# Patient Record
Sex: Male | Born: 1970
Health system: Southern US, Community
[De-identification: ages and names within clinical notes are randomized; demographics above are authoritative.]

## PROBLEM LIST (undated history)

## (undated) DIAGNOSIS — E785 Hyperlipidemia, unspecified: Secondary | ICD-10-CM

## (undated) DIAGNOSIS — I10 Essential (primary) hypertension: Secondary | ICD-10-CM

## (undated) HISTORY — PX: KIDNEY STONE SURGERY: SHX686

## (undated) HISTORY — DX: Hyperlipidemia, unspecified: E78.5

## (undated) HISTORY — DX: Essential (primary) hypertension: I10

---

## 2009-11-16 ENCOUNTER — Emergency Department (HOSPITAL_COMMUNITY): Admission: EM | Admit: 2009-11-16 | Discharge: 2009-11-17 | Payer: Self-pay | Admitting: Emergency Medicine

## 2010-05-20 LAB — DIFFERENTIAL
Basophils Absolute: 0 K/uL (ref 0.0–0.1)
Basophils Relative: 0 % (ref 0–1)
Eosinophils Absolute: 0 K/uL (ref 0.0–0.7)
Eosinophils Relative: 0 % (ref 0–5)
Lymphocytes Relative: 13 % (ref 12–46)
Lymphs Abs: 1.4 K/uL (ref 0.7–4.0)
Monocytes Absolute: 1.1 10*3/uL — ABNORMAL HIGH (ref 0.1–1.0)
Monocytes Relative: 10 % (ref 3–12)
Neutro Abs: 8.3 10*3/uL — ABNORMAL HIGH (ref 1.7–7.7)
Neutrophils Relative %: 77 % (ref 43–77)

## 2010-05-20 LAB — BASIC METABOLIC PANEL
Calcium: 8.7 mg/dL (ref 8.4–10.5)
GFR calc Af Amer: >60 mL/min (ref >60)
GFR calc non Af Amer: >60 mL/min (ref >60)
Potassium: 3.4 mEq/L — ABNORMAL LOW (ref 3.5–5.1)
Sodium: 140 mEq/L (ref 135–145)

## 2010-05-20 LAB — BASIC METABOLIC PANEL WITH GFR
BUN: 10 mg/dL (ref 6–23)
CO2: 27 meq/L (ref 19–32)
Chloride: 105 meq/L (ref 96–112)
Creatinine, Ser: 0.98 mg/dL (ref 0.4–1.5)
Glucose, Bld: 97 mg/dL (ref 70–99)

## 2010-05-20 LAB — CBC
HCT: 44.6 % (ref 39.0–52.0)
Hemoglobin: 14.9 g/dL (ref 13.0–17.0)
MCH: 30.3 pg (ref 26.0–34.0)
MCHC: 33.4 g/dL (ref 30.0–36.0)
MCV: 90.7 fL (ref 78.0–100.0)
Platelets: 202 K/uL (ref 150–400)
RBC: 4.92 MIL/uL (ref 4.22–5.81)
RDW: 13.3 % (ref 11.5–15.5)
WBC: 10.9 10*3/uL — ABNORMAL HIGH (ref 4.0–10.5)

## 2012-06-11 ENCOUNTER — Other Ambulatory Visit: Payer: Self-pay | Admitting: Physician Assistant

## 2015-09-25 DIAGNOSIS — J0100 Acute maxillary sinusitis, unspecified: Secondary | ICD-10-CM | POA: Diagnosis not present

## 2015-09-25 DIAGNOSIS — H66001 Acute suppurative otitis media without spontaneous rupture of ear drum, right ear: Secondary | ICD-10-CM | POA: Diagnosis not present

## 2016-06-13 DIAGNOSIS — H60392 Other infective otitis externa, left ear: Secondary | ICD-10-CM | POA: Diagnosis not present

## 2016-06-23 ENCOUNTER — Ambulatory Visit (INDEPENDENT_AMBULATORY_CARE_PROVIDER_SITE_OTHER): Payer: BLUE CROSS/BLUE SHIELD | Admitting: Family Medicine

## 2016-06-23 ENCOUNTER — Encounter: Payer: Self-pay | Admitting: Family Medicine

## 2016-06-23 VITALS — BP 147/99 | HR 93 | Temp 98.2°F | Ht 71.0 in | Wt >= 6400 oz

## 2016-06-23 DIAGNOSIS — R748 Abnormal levels of other serum enzymes: Secondary | ICD-10-CM

## 2016-06-23 DIAGNOSIS — G4733 Obstructive sleep apnea (adult) (pediatric): Secondary | ICD-10-CM

## 2016-06-23 DIAGNOSIS — E785 Hyperlipidemia, unspecified: Secondary | ICD-10-CM

## 2016-06-23 DIAGNOSIS — Z23 Encounter for immunization: Secondary | ICD-10-CM

## 2016-06-23 DIAGNOSIS — M259 Joint disorder, unspecified: Secondary | ICD-10-CM | POA: Diagnosis not present

## 2016-06-23 DIAGNOSIS — I10 Essential (primary) hypertension: Secondary | ICD-10-CM | POA: Diagnosis not present

## 2016-06-23 DIAGNOSIS — H60332 Swimmer's ear, left ear: Secondary | ICD-10-CM

## 2016-06-23 MED ORDER — LOSARTAN POTASSIUM 25 MG PO TABS: 25.0000 mg | ORAL_TABLET | Freq: Every day | ORAL | 2 refills | Status: DC

## 2016-06-23 MED ORDER — MELOXICAM 15 MG PO TABS
15.0000 mg | ORAL_TABLET | Freq: Every day | ORAL | 2 refills | Status: DC
Start: 2016-06-23 — End: 2016-10-14

## 2016-06-23 MED ORDER — AMLODIPINE BESYLATE 5 MG PO TABS: 5.0000 mg | ORAL_TABLET | Freq: Every day | ORAL | 2 refills | Status: DC

## 2016-06-23 MED ORDER — NEOMYCIN-POLYMYXIN-HC 3.5-10000-1 OT SOLN: 3.0000 [drp] | Freq: Four times a day (QID) | OTIC | 0 refills | Status: DC

## 2016-06-23 MED ORDER — PRAVASTATIN SODIUM 40 MG PO TABS: 40.0000 mg | ORAL_TABLET | Freq: Every day | ORAL | 2 refills | Status: DC

## 2016-06-23 NOTE — Progress Notes (Signed)
BP (!) 146/97   Pulse 93   Temp 98.2 F (36.8 C) (Oral)   Ht 5' 11"  (1.803 m)   Wt (!) 444 lb (201.4 kg)   BMI 61.93 kg/m    Subjective:    Patient ID: Nathaniel Martin, male    DOB: [birth year], 46 y.o.   MRN: [id number]  HPI: Nathaniel Martin is a 46 y.o. male presenting on 06/23/2016 for Establish Care (patient out of Losartan x 2 weeks) and Follow up on ear infection (left ear)   HPI Hyperlipidemia Patient is coming in for check of his hyperlipidemia and to establish care with our office as a new patient. He is currently taking pravastatin. He denies any issues with myalgias or history of liver damage from it. He denies any focal numbness or weakness or chest pain.   Hypertension Patient is coming in for a blood pressure check and to establish care with our office. His blood pressure is 146/97. He is currently on amlodipine and losartan. He has been taking losartan but ran out of the amlodipine a week and a half ago. Patient denies headaches, blurred vision, chest pains, shortness of breath, or weakness. Denies any side effects from medication and is content with current medication.   Bilateral knee pain Patient has chronic bilateral knee issues and takes Tylenol and ibuprofen and says that they worked somewhat for it. He would like to try something different if possible. The knee pain is throughout the joints but hurts more on the medial aspect of both knees and lateral. He denies any popping or catching or giving way of his knees or redness or warmth. The knee pain is more of a tight squeezing sensation.  Sleep apnea and obesity Patient says he has been diagnosed with sleep apnea and he is obese as well. He has not been able to use the mask because of issues with it but he has not seen a breaking sleep medicine for quite some time. His patient coming from Dr. Eula Fried and Dr. Eula Fried is the one who initially ordered this for him.  Left ear pain and drainage Patient has been  having left ear pain and drainage out of that ear that has been bothering him for the past few days. He is never has these issues before. He denies any fevers or chills or sinus congestion or cough or runny nose.  Elevated liver enzymes Patient has elevated liver enzymes on external lab is coming in to get him recheck today. They were suspicious that it was due to his morbid obesity and fatty liver disease but he never did a full workup for it. He denies any abdominal pain or skin color discoloration.  Relevant past medical, surgical, family and social history reviewed and updated as indicated. Interim medical history since our last visit reviewed. Allergies and medications reviewed and updated.  Review of Systems  Constitutional: Negative for chills and fever.  HENT: Positive for ear discharge and ear pain.   Eyes: Negative for discharge.  Respiratory: Negative for shortness of breath and wheezing.   Cardiovascular: Negative for chest pain and leg swelling.  Gastrointestinal: Negative for abdominal pain, constipation, diarrhea and vomiting.  Musculoskeletal: Positive for arthralgias. Negative for back pain and gait problem.  Skin: Negative for color change and rash.  Psychiatric/Behavioral: Positive for sleep disturbance.  All other systems reviewed and are negative.   Per HPI unless specifically indicated above  Social History   Social History  . Marital status: Married  Spouse name: N/A  . Number of children: N/A  . Years of education: N/A   Occupational History  . Not on file.   Social History Main Topics  . Smoking status: Never Smoker  . Smokeless tobacco: Never Used  . Alcohol use No  . Drug use: No  . Sexual activity: Not Currently     Comment: married 12 years, 1 daughter, gone from house   Other Topics Concern  . Not on file   Social History Narrative  . No narrative on file    Past Surgical History:  Procedure Laterality Date  . KIDNEY STONE SURGERY        Family History  Problem Relation Age of Onset  . Diabetes Mother   . Diabetes Father   . Cancer Father   . Diabetes Brother   . Heart disease Paternal Grandmother   . Heart attack Paternal Grandmother   . Heart disease Paternal Grandfather   . Heart attack Paternal Grandfather   . Diabetes Brother     Allergies as of 06/23/2016   No Known Allergies     Medication List       Accurate as of 06/23/16  9:34 AM. Always use your most recent med list.          acetaminophen 500 MG tablet Commonly known as:  TYLENOL Take 1,000 mg by mouth every 6 (six) hours as needed.   amLODipine 5 MG tablet Commonly known as:  NORVASC Take 5 mg by mouth daily.   ibuprofen 400 MG tablet Commonly known as:  ADVIL,MOTRIN Take 400 mg by mouth every 6 (six) hours as needed.   losartan 25 MG tablet Commonly known as:  COZAAR Take 25 mg by mouth daily.   pravastatin 40 MG tablet Commonly known as:  PRAVACHOL Take 40 mg by mouth daily.          Objective:    BP (!) 146/97   Pulse 93   Temp 98.2 F (36.8 C) (Oral)   Ht 5' 11"  (1.803 m)   Wt (!) 444 lb (201.4 kg)   BMI 61.93 kg/m   Wt Readings from Last 3 Encounters:  06/23/16 (!) 444 lb (201.4 kg)    Physical Exam  Constitutional: He is oriented to person, place, and time. He appears well-developed and well-nourished. No distress.  HENT:  Right Ear: No swelling or tenderness. No mastoid tenderness. Tympanic membrane is not injected, not scarred, not perforated, not erythematous and not retracted. No hemotympanum. No decreased hearing is noted.  Left Ear: There is drainage, swelling and tenderness. No mastoid tenderness. Tympanic membrane is not injected, not scarred, not perforated, not erythematous and not retracted. No decreased hearing is noted.  Eyes: Conjunctivae are normal. No scleral icterus.  Neck: Neck supple. No thyromegaly present.  Cardiovascular: Normal rate, regular rhythm, normal heart sounds and intact distal  pulses.   No murmur heard. Pulmonary/Chest: Effort normal and breath sounds normal. No respiratory distress. He has no wheezes.  Abdominal: Soft. Bowel sounds are normal. He exhibits no distension. There is no tenderness. There is no rebound.  Musculoskeletal: Normal range of motion. He exhibits no edema.       Right knee: He exhibits normal range of motion, no swelling, normal alignment, no LCL laxity, no bony tenderness, normal meniscus and no MCL laxity. Tenderness found. Medial joint line tenderness noted.       Left knee: He exhibits no erythema, normal alignment, no LCL laxity, no bony tenderness, normal meniscus  and no MCL laxity. Tenderness found. Medial joint line tenderness noted.  Lymphadenopathy:    He has no cervical adenopathy.  Neurological: He is alert and oriented to person, place, and time. Coordination normal.  Skin: Skin is warm and dry. No rash noted. He is not diaphoretic.  Psychiatric: He has a normal mood and affect. His behavior is normal.  Nursing note and vitals reviewed.       Assessment & Plan:   Problem List Items Addressed This Visit      Cardiovascular and Mediastinum   Hypertension   Relevant Medications   amLODipine (NORVASC) 5 MG tablet   losartan (COZAAR) 25 MG tablet   pravastatin (PRAVACHOL) 40 MG tablet   Other Relevant Orders   CMP14+EGFR (Completed)     Other   Hyperlipidemia LDL goal <130 - Primary   Relevant Medications   amLODipine (NORVASC) 5 MG tablet   losartan (COZAAR) 25 MG tablet   pravastatin (PRAVACHOL) 40 MG tablet   Other Relevant Orders   Lipid panel (Completed)   Morbid obesity ([place])   Relevant Orders   CBC with Differential/Platelet (Completed)   TSH (Completed)   Ambulatory referral to Sleep Studies   Knee problem    Other Visit Diagnoses    Obstructive sleep apnea syndrome       Relevant Orders   CBC with Differential/Platelet (Completed)   TSH (Completed)   Ambulatory referral to Sleep Studies   Acute  swimmer's ear of left side       Relevant Medications   neomycin-polymyxin-hydrocortisone (CORTISPORIN) otic solution   Elevated liver enzymes       Relevant Orders   US Abdomen Limited RUQ       Follow up plan: Return in about 4 weeks (around 07/21/2016), or if symptoms worsen or fail to improve, for htn.  Caryl Pina, MD [facility] 06/23/2016, 9:34 AM

## 2016-06-24 LAB — CBC WITH DIFFERENTIAL/PLATELET
Basophils Absolute: 0 10*3/uL (ref 0.0–0.2)
Basos: 0 %
EOS (ABSOLUTE): 0.1 10*3/uL (ref 0.0–0.4)
Eos: 2 %
Hematocrit: 42.6 % (ref 37.5–51.0)
Hemoglobin: 14.4 g/dL (ref 13.0–17.7)
Immature Grans (Abs): 0 10*3/uL (ref 0.0–0.1)
Immature Granulocytes: 0 %
Lymphocytes Absolute: 1.8 10*3/uL (ref 0.7–3.1)
Lymphs: 26 %
MCH: 29.6 pg (ref 26.6–33.0)
MCHC: 33.8 g/dL (ref 31.5–35.7)
MCV: 88 fL (ref 79–97)
Monocytes Absolute: 0.5 10*3/uL (ref 0.1–0.9)
Monocytes: 7 %
Neutrophils Absolute: 4.5 10*3/uL (ref 1.4–7.0)
Neutrophils: 65 %
Platelets: 287 10*3/uL (ref 150–379)
RBC: 4.87 x10E6/uL (ref 4.14–5.80)
RDW: 13.1 % (ref 12.3–15.4)
WBC: 6.9 10*3/uL (ref 3.4–10.8)

## 2016-06-24 LAB — LIPID PANEL
Chol/HDL Ratio: 4.7 ratio (ref 0.0–5.0)
Cholesterol, Total: 174 mg/dL (ref 100–199)
HDL: 37 mg/dL — ABNORMAL LOW (ref >39)
LDL Calculated: 119 mg/dL — ABNORMAL HIGH (ref 0–99)
Triglycerides: 90 mg/dL (ref 0–149)
VLDL Cholesterol Cal: 18 mg/dL (ref 5–40)

## 2016-06-24 LAB — CMP14+EGFR
ALT: 56 IU/L — ABNORMAL HIGH (ref 0–44)
AST: 53 IU/L — ABNORMAL HIGH (ref 0–40)
Albumin/Globulin Ratio: 1.2 (ref 1.2–2.2)
Albumin: 4.3 g/dL (ref 3.5–5.5)
Alkaline Phosphatase: 101 IU/L (ref 39–117)
BUN/Creatinine Ratio: 11 (ref 9–20)
BUN: 10 mg/dL (ref 6–24)
Bilirubin Total: 0.3 mg/dL (ref 0.0–1.2)
CO2: 23 mmol/L (ref 18–29)
Calcium: 9.4 mg/dL (ref 8.7–10.2)
Chloride: 101 mmol/L (ref 96–106)
Creatinine, Ser: 0.91 mg/dL (ref 0.76–1.27)
GFR calc Af Amer: 117 mL/min/{1.73_m2} (ref >59)
GFR calc non Af Amer: 101 mL/min/{1.73_m2} (ref >59)
Globulin, Total: 3.5 g/dL (ref 1.5–4.5)
Glucose: 117 mg/dL — ABNORMAL HIGH (ref 65–99)
Potassium: 4.8 mmol/L (ref 3.5–5.2)
Sodium: 143 mmol/L (ref 134–144)
Total Protein: 7.8 g/dL (ref 6.0–8.5)

## 2016-06-24 LAB — TSH: TSH: 1.16 u[IU]/mL (ref 0.450–4.500)

## 2016-06-27 LAB — SPECIMEN STATUS REPORT

## 2016-06-27 LAB — HGB A1C W/O EAG: Hgb A1c MFr Bld: 6.1 % — ABNORMAL HIGH (ref 4.8–5.6)

## 2016-07-04 ENCOUNTER — Ambulatory Visit (HOSPITAL_COMMUNITY): Payer: Self-pay

## 2016-07-06 ENCOUNTER — Ambulatory Visit (HOSPITAL_COMMUNITY): Admission: RE | Admit: 2016-07-06 | Payer: BLUE CROSS/BLUE SHIELD | Source: Ambulatory Visit

## 2016-07-12 ENCOUNTER — Ambulatory Visit: Payer: BLUE CROSS/BLUE SHIELD | Admitting: Pharmacist

## 2016-07-20 ENCOUNTER — Institutional Professional Consult (permissible substitution): Payer: BLUE CROSS/BLUE SHIELD | Admitting: Neurology

## 2016-07-21 ENCOUNTER — Encounter: Payer: Self-pay | Admitting: Family Medicine

## 2016-07-21 ENCOUNTER — Ambulatory Visit (INDEPENDENT_AMBULATORY_CARE_PROVIDER_SITE_OTHER): Payer: BLUE CROSS/BLUE SHIELD | Admitting: Family Medicine

## 2016-07-21 VITALS — BP 137/82 | HR 87 | Temp 97.6°F | Ht 71.0 in | Wt >= 6400 oz

## 2016-07-21 DIAGNOSIS — I10 Essential (primary) hypertension: Secondary | ICD-10-CM

## 2016-07-21 NOTE — Progress Notes (Signed)
   BP 137/82   Pulse 87   Temp 97.6 F (36.4 C) (Oral)   Ht 5\' 11"  (1.803 m)   Wt (!) 445 lb (201.9 kg)   BMI 62.06 kg/m    Subjective:    Patient ID: Nathaniel Martin CurrentJonathon E Martin, male    DOB: 06/20/1970, 46 y.o.   MRN: [id number]  HPI: Nathaniel Martin CurrentJonathon E Martin is a 46 y.o. male presenting on 07/21/2016 for Hypertension (4 week followup)   HPI Hypertension Patient is currently on amlodipine and losartan, and her blood pressure today is 137/82, today was just a recheck of that and it is much improved. Patient denies any lightheadedness or dizziness. Patient denies headaches, blurred vision, chest pains, shortness of breath, or weakness. Denies any side effects from medication and is content with current medication.   Relevant past medical, surgical, family and social history reviewed and updated as indicated. Interim medical history since our last visit reviewed. Allergies and medications reviewed and updated.  Review of Systems  Constitutional: Negative for chills and fever.  Respiratory: Negative for shortness of breath and wheezing.   Cardiovascular: Negative for chest pain and leg swelling.  Musculoskeletal: Negative for back pain and gait problem.  Skin: Negative for rash.  Neurological: Negative for dizziness, light-headedness and headaches.  All other systems reviewed and are negative.   Per HPI unless specifically indicated above        Objective:    BP 137/82   Pulse 87   Temp 97.6 F (36.4 C) (Oral)   Ht 5\' 11"  (1.803 m)   Wt (!) 445 lb (201.9 kg)   BMI 62.06 kg/m   Wt Readings from Last 3 Encounters:  07/21/16 (!) 445 lb (201.9 kg)  06/23/16 (!) 444 lb (201.4 kg)    Physical Exam  Constitutional: He is oriented to person, place, and time. He appears well-developed and well-nourished. No distress.  Morbidly obese  Eyes: Conjunctivae are normal. No scleral icterus.  Cardiovascular: Normal rate, regular rhythm, normal heart sounds and intact distal pulses.   No  murmur heard. Pulmonary/Chest: Effort normal and breath sounds normal. No respiratory distress. He has no wheezes. He has no rales.  Musculoskeletal: Normal range of motion. He exhibits no edema.  Neurological: He is alert and oriented to person, place, and time. Coordination normal.  Skin: Skin is warm and dry. No rash noted. He is not diaphoretic.  Psychiatric: He has a normal mood and affect. His behavior is normal.  Nursing note and vitals reviewed.     Assessment & Plan:   Problem List Items Addressed This Visit      Cardiovascular and Mediastinum   Hypertension - Primary    Blood pressure much improved today. Return in 5-6 months and continue to monitor.          Follow up plan: Return in about 6 months (around 01/21/2017), or if symptoms worsen or fail to improve, for Hypertension and fasting labs.  Counseling provided for all of the vaccine components No orders of the defined types were placed in this encounter.   Arville CareJoshua Carrington Olazabal, MD [facility] 07/21/2016, 8:46 AM

## 2016-07-21 NOTE — Assessment & Plan Note (Signed)
Blood pressure much improved today. Return in 5-6 months and continue to monitor.

## 2016-10-14 ENCOUNTER — Ambulatory Visit (INDEPENDENT_AMBULATORY_CARE_PROVIDER_SITE_OTHER): Payer: BLUE CROSS/BLUE SHIELD | Admitting: Family Medicine

## 2016-10-14 ENCOUNTER — Encounter: Payer: Self-pay | Admitting: Family Medicine

## 2016-10-14 VITALS — BP 138/83 | HR 75 | Temp 97.0°F | Ht 71.0 in | Wt >= 6400 oz

## 2016-10-14 DIAGNOSIS — Z713 Dietary counseling and surveillance: Secondary | ICD-10-CM

## 2016-10-14 DIAGNOSIS — S39012A Strain of muscle, fascia and tendon of lower back, initial encounter: Secondary | ICD-10-CM | POA: Diagnosis not present

## 2016-10-14 MED ORDER — TOPIRAMATE 25 MG PO TABS: 25.0000 mg | ORAL_TABLET | Freq: Two times a day (BID) | ORAL | 2 refills | Status: DC

## 2016-10-14 MED ORDER — PHENTERMINE HCL 30 MG PO CAPS: 30.0000 mg | ORAL_CAPSULE | ORAL | 0 refills | Status: DC

## 2016-10-14 MED ORDER — CYCLOBENZAPRINE HCL 10 MG PO TABS: 10.0000 mg | ORAL_TABLET | Freq: Three times a day (TID) | ORAL | 0 refills | Status: DC | PRN

## 2016-10-14 MED ORDER — PREDNISONE 20 MG PO TABS: ORAL_TABLET | ORAL | 0 refills | Status: DC

## 2016-10-14 NOTE — Progress Notes (Signed)
BP 138/83   Pulse 75   Temp (!) 97 F (36.1 C) (Oral)   Ht 5\' 11"  (1.803 m)   Wt (!) 432 lb (196 kg)   BMI 60.25 kg/m    Subjective:    Patient ID: Nathaniel Martin, male    DOB: [birth year], 46 y.o.   MRN: [id number]  HPI: Nathaniel Martin is a 46 y.o. male presenting on 10/14/2016 for Back Pain (pt here today c/o lower back pain)   HPI Low back pain and morbid obesity Patient is coming in today complaining of low back pain. He has been having chronic low back pain but is acutely worsened over the past couple weeks. He says that it hurts in a bandlike area across his lower and mid back and rates it as 7 out of 10 currently. He denies any fevers or chills or loss of bowel or bladder. He denies any pain radiating down legs  Relevant past medical, surgical, family and social history reviewed and updated as indicated. Interim medical history since our last visit reviewed. Allergies and medications reviewed and updated.  Review of Systems  Constitutional: Negative for chills and fever.  Respiratory: Negative for shortness of breath and wheezing.   Cardiovascular: Negative for chest pain and leg swelling.  Gastrointestinal: Negative for abdominal pain.  Musculoskeletal: Positive for back pain. Negative for gait problem.  Skin: Negative for rash.  Neurological: Negative for dizziness and light-headedness.  All other systems reviewed and are negative.   Per HPI unless specifically indicated above      Objective:    BP 138/83   Pulse 75   Temp (!) 97 F (36.1 C) (Oral)   Ht 5\' 11"  (1.803 m)   Wt (!) 432 lb (196 kg)   BMI 60.25 kg/m   Wt Readings from Last 3 Encounters:  10/14/16 (!) 432 lb (196 kg)  07/21/16 (!) 445 lb (201.9 kg)  06/23/16 (!) 444 lb (201.4 kg)    Physical Exam  Constitutional: He is oriented to person, place, and time. He appears well-developed and well-nourished. No distress.  Eyes: Conjunctivae are normal. No scleral icterus.  Cardiovascular:  Normal rate, regular rhythm, normal heart sounds and intact distal pulses.   No murmur heard. Pulmonary/Chest: Effort normal and breath sounds normal. No respiratory distress. He has no wheezes. He has no rales.  Abdominal: Soft. Bowel sounds are normal. He exhibits no distension. There is no tenderness. There is no rebound.  Musculoskeletal: Normal range of motion. He exhibits no edema.       Lumbar back: He exhibits tenderness (band-like area in low back). He exhibits normal range of motion, no bony tenderness and no deformity.  Neurological: He is alert and oriented to person, place, and time. Coordination normal.  Skin: Skin is warm and dry. No rash noted. He is not diaphoretic.  Psychiatric: He has a normal mood and affect. His behavior is normal.  Nursing note and vitals reviewed.      Assessment & Plan:   Problem List Items Addressed This Visit      Other   Morbid obesity (HCC)   Relevant Medications   phentermine 30 MG capsule   topiramate (TOPAMAX) 25 MG tablet    Other Visit Diagnoses    Lumbar strain, initial encounter    -  Primary   Relevant Medications   predniSONE (DELTASONE) 20 MG tablet   cyclobenzaprine (FLEXERIL) 10 MG tablet   Weight loss counseling, encounter for  Relevant Medications   phentermine 30 MG capsule   topiramate (TOPAMAX) 25 MG tablet       Follow up plan: Return in about 4 weeks (around 11/11/2016), or if symptoms worsen or fail to improve, for Recheck weight.  Counseling provided for all of the vaccine components No orders of the defined types were placed in this encounter.   Arville CareJoshua Dettinger, MD [facility] 10/14/2016, 10:34 AM

## 2016-11-10 ENCOUNTER — Ambulatory Visit: Payer: BLUE CROSS/BLUE SHIELD | Admitting: Family Medicine

## 2016-12-21 ENCOUNTER — Ambulatory Visit (INDEPENDENT_AMBULATORY_CARE_PROVIDER_SITE_OTHER): Payer: BLUE CROSS/BLUE SHIELD | Admitting: Family Medicine

## 2016-12-21 ENCOUNTER — Encounter: Payer: Self-pay | Admitting: Family Medicine

## 2016-12-21 VITALS — BP 135/83 | HR 88 | Temp 97.6°F | Ht 71.0 in | Wt >= 6400 oz

## 2016-12-21 DIAGNOSIS — I10 Essential (primary) hypertension: Secondary | ICD-10-CM

## 2016-12-21 DIAGNOSIS — E1169 Type 2 diabetes mellitus with other specified complication: Secondary | ICD-10-CM | POA: Insufficient documentation

## 2016-12-21 DIAGNOSIS — E785 Hyperlipidemia, unspecified: Secondary | ICD-10-CM

## 2016-12-21 DIAGNOSIS — R7303 Prediabetes: Secondary | ICD-10-CM

## 2016-12-21 LAB — BAYER DCA HB A1C WAIVED: HB A1C: 5.8 % (ref <7.0)

## 2016-12-21 NOTE — Progress Notes (Signed)
 BP 135/83   Pulse 88   Temp 97.6 F (36.4 C) (Oral)   Ht 5' 11" (1.803 m)   Wt (!) 411 lb 4 oz (186.5 kg)   BMI 57.36 kg/m    Subjective:    Patient ID: Nathaniel Martin, male    DOB: 07/06/1970, 46 y.o.   MRN: [id number]  HPI: Nathaniel Martin is a 46 y.o. male presenting on 12/21/2016 for Hyperlipidemia (follow up); Hypertension; and Weight Loss (stopped Phenterimine and Topamax, did not feel was helping, made him jittery)   HPI Hypertension Patient is currently on losartan and amlodipine, and their blood pressure today is 135/83. Patient denies any lightheadedness or dizziness. Patient denies headaches, blurred vision, chest pains, shortness of breath, or weakness. Denies any side effects from medication and is content with current medication.   Hyperlipidemia Patient is coming in for recheck of his hyperlipidemia. The patient is currently taking pravastatin. They deny any issues with myalgias or history of liver damage from it. They deny any focal numbness or weakness or chest pain.   Prediabetes Patient comes in today for recheck of his diabetes. Patient has been currently taking dietary and lifestyle changes, patient is down 20 pounds from where he was previously. Patient is currently on an ACE inhibitor/ARB. Patient has not seen an ophthalmologist this year. Patient denies any issues with their feet.   Relevant past medical, surgical, family and social history reviewed and updated as indicated. Interim medical history since our last visit reviewed. Allergies and medications reviewed and updated.  Review of Systems  Constitutional: Negative for chills and fever.  Respiratory: Negative for cough, shortness of breath and wheezing.   Cardiovascular: Negative for chest pain, palpitations and leg swelling.  Musculoskeletal: Positive for arthralgias and back pain. Negative for myalgias.  Skin: Negative for rash.  Neurological: Negative for dizziness, weakness and headaches.   Psychiatric/Behavioral: Negative for suicidal ideas.    Per HPI unless specifically indicated above     Objective:    BP 135/83   Pulse 88   Temp 97.6 F (36.4 C) (Oral)   Ht 5' 11" (1.803 m)   Wt (!) 411 lb 4 oz (186.5 kg)   BMI 57.36 kg/m   Wt Readings from Last 3 Encounters:  12/21/16 (!) 411 lb 4 oz (186.5 kg)  10/14/16 (!) 432 lb (196 kg)  07/21/16 (!) 445 lb (201.9 kg)    Physical Exam  Constitutional: He is oriented to person, place, and time. He appears well-developed and well-nourished. No distress.  Eyes: Conjunctivae are normal. No scleral icterus.  Cardiovascular: Normal rate, regular rhythm, normal heart sounds and intact distal pulses.   No murmur heard. Pulmonary/Chest: Effort normal and breath sounds normal. No respiratory distress. He has no wheezes. He has no rales.  Musculoskeletal: Normal range of motion. He exhibits no edema.  Neurological: He is alert and oriented to person, place, and time. Coordination normal.  Skin: Skin is warm and dry. No rash noted. He is not diaphoretic.  Psychiatric: He has a normal mood and affect. His behavior is normal.  Nursing note and vitals reviewed.     Assessment & Plan:   Problem List Items Addressed This Visit      Cardiovascular and Mediastinum   Hypertension - Primary   Relevant Orders   CMP14+EGFR   Thyroid Panel With TSH     Other   Hyperlipidemia LDL goal <130   Relevant Orders   Lipid panel   Morbid obesity (HCC)     Relevant Orders   Thyroid Panel With TSH   Prediabetes   Relevant Orders   Bayer DCA Hb A1c Waived   CMP14+EGFR   Thyroid Panel With TSH     Continue patient's current medications, discussed Victoza for possible weight loss but patient does not want to do it right now. He has been losing weight on his own he would like to continue to try and his own.  Follow up plan: Return in about 6 months (around 06/21/2017), or if symptoms worsen or fail to improve, for Hypertension and  prediabetes.  Counseling provided for all of the vaccine components Orders Placed This Encounter  Procedures  . Bayer DCA Hb A1c Waived  . CMP14+EGFR  . Lipid panel  . Thyroid Panel With TSH    Joshua Dettinger, MD [facility] 12/21/2016, 8:40 AM     

## 2016-12-22 LAB — CMP14+EGFR
ALT: 28 IU/L (ref 0–44)
AST: 30 IU/L (ref 0–40)
Albumin/Globulin Ratio: 1.5 (ref 1.2–2.2)
Albumin: 4.4 g/dL (ref 3.5–5.5)
Alkaline Phosphatase: 85 IU/L (ref 39–117)
BUN/Creatinine Ratio: 18 (ref 9–20)
BUN: 17 mg/dL (ref 6–24)
Bilirubin Total: 0.5 mg/dL (ref 0.0–1.2)
CALCIUM: 9.4 mg/dL (ref 8.7–10.2)
CO2: 19 mmol/L — AB (ref 20–29)
CREATININE: 0.94 mg/dL (ref 0.76–1.27)
Chloride: 105 mmol/L (ref 96–106)
GFR calc Af Amer: 113 mL/min/{1.73_m2} (ref >59)
GFR, EST NON AFRICAN AMERICAN: 98 mL/min/{1.73_m2} (ref >59)
GLOBULIN, TOTAL: 2.9 g/dL (ref 1.5–4.5)
Glucose: 107 mg/dL — ABNORMAL HIGH (ref 65–99)
Potassium: 4.6 mmol/L (ref 3.5–5.2)
Sodium: 144 mmol/L (ref 134–144)
TOTAL PROTEIN: 7.3 g/dL (ref 6.0–8.5)

## 2016-12-22 LAB — THYROID PANEL WITH TSH
Free Thyroxine Index: 2 (ref 1.2–4.9)
T3 Uptake Ratio: 26 % (ref 24–39)
T4, Total: 7.8 ug/dL (ref 4.5–12.0)
TSH: 1.54 u[IU]/mL (ref 0.450–4.500)

## 2016-12-22 LAB — LIPID PANEL
CHOL/HDL RATIO: 3.2 ratio (ref 0.0–5.0)
Cholesterol, Total: 126 mg/dL (ref 100–199)
HDL: 40 mg/dL (ref >39)
LDL CALC: 71 mg/dL (ref 0–99)
TRIGLYCERIDES: 74 mg/dL (ref 0–149)
VLDL Cholesterol Cal: 15 mg/dL (ref 5–40)

## 2017-01-16 ENCOUNTER — Ambulatory Visit (INDEPENDENT_AMBULATORY_CARE_PROVIDER_SITE_OTHER): Payer: BLUE CROSS/BLUE SHIELD

## 2017-01-16 ENCOUNTER — Encounter: Payer: Self-pay | Admitting: Family

## 2017-01-16 ENCOUNTER — Ambulatory Visit: Payer: BLUE CROSS/BLUE SHIELD | Admitting: Family

## 2017-01-16 VITALS — BP 130/76 | HR 76 | Temp 97.1°F | Ht 71.0 in

## 2017-01-16 DIAGNOSIS — M25562 Pain in left knee: Secondary | ICD-10-CM | POA: Diagnosis not present

## 2017-01-16 DIAGNOSIS — M179 Osteoarthritis of knee, unspecified: Secondary | ICD-10-CM | POA: Diagnosis not present

## 2017-01-16 DIAGNOSIS — S8392XA Sprain of unspecified site of left knee, initial encounter: Secondary | ICD-10-CM

## 2017-01-16 MED ORDER — PREDNISONE 10 MG (21) PO TBPK: ORAL_TABLET | ORAL | 0 refills | Status: DC

## 2017-01-16 MED ORDER — NAPROXEN 500 MG PO TABS: 500.0000 mg | ORAL_TABLET | Freq: Two times a day (BID) | ORAL | 1 refills | Status: DC

## 2017-01-16 NOTE — Progress Notes (Signed)
   Subjective:    Patient ID: Nathaniel Martin, male    DOB: 12/16/1970, 46 y.o.   MRN: [id number]  Knee Pain   The incident occurred 3 to 5 days ago. There was no injury mechanism. The pain is present in the left knee. The quality of the pain is described as aching. The pain is at a severity of 9/10. The pain is moderate. The pain has been intermittent since onset. He reports no foreign bodies present. The symptoms are aggravated by movement and weight bearing. He has tried acetaminophen, rest, non-weight bearing and NSAIDs for the symptoms. The treatment provided mild relief.      Review of Systems  Musculoskeletal: Positive for arthralgias (left knee).  All other systems reviewed and are negative.      Objective:   Physical Exam  Constitutional: He is oriented to person, place, and time. He appears well-developed and well-nourished. No distress.  HENT:  Head: Normocephalic.  Right Ear: External ear normal.  Left Ear: External ear normal.  Mouth/Throat: Oropharynx is clear and moist.  Eyes: Pupils are equal, round, and reactive to light. Right eye exhibits no discharge. Left eye exhibits no discharge.  Neck: Normal range of motion. Neck supple. No thyromegaly present.  Cardiovascular: Normal rate, regular rhythm, normal heart sounds and intact distal pulses.  No murmur heard. Pulmonary/Chest: Effort normal and breath sounds normal. No respiratory distress. He has no wheezes.  Abdominal: Soft. Bowel sounds are normal. He exhibits no distension. There is no tenderness.  Musculoskeletal: Normal range of motion. He exhibits tenderness. He exhibits no edema.  Tenderness in lower and medial left knee with flexion, extension, and weight bearing   Neurological: He is alert and oriented to person, place, and time. He has normal reflexes. No cranial nerve deficit.  Skin: Skin is warm and dry. No rash noted. No erythema.  Psychiatric: He has a normal mood and affect. His behavior is normal.  Judgment and thought content normal.  Vitals reviewed.   X-ray- Negative,  Preliminary reading by Jannifer Rodneyhristy Normal Recinos, FNP [facility]   BP 130/76   Pulse 76   Temp (!) 97.1 F (36.2 C) (Oral)   Ht 5\' 11"  (1.803 m)   BMI 57.36 kg/m      Assessment & Plan:  1. Acute pain of left knee - DG Knee 1-2 Views Left; Future  2. Morbid obesity (HCC)  3. Sprain of left knee, unspecified ligament, initial encounter Rest Ice  ROM exercises discussed Weight loss encouraged Low carb diet while taking prednisone RTO prn or if symptoms do not improve or worsen - naproxen (NAPROSYN) 500 MG tablet; Take 1 tablet (500 mg total) 2 (two) times daily with a meal by mouth.  Dispense: 60 tablet; Refill: 1 - predniSONE (STERAPRED UNI-PAK 21 TAB) 10 MG (21) TBPK tablet; Use as directed  Dispense: 21 tablet; Refill: 0    Jannifer Rodneyhristy Rayson Rando, FNP

## 2017-01-16 NOTE — Patient Instructions (Signed)
Combined Knee Ligament Sprain A ligament is a type of fibrous tissue that connects one bone to another bone. There are four ligaments in your knee. Together, they provide stability for your knee joint. A combined knee ligament sprain is an injury in which more than one knee ligament is severely stretched or torn. This kind of injury is also called an injury to multiple structures of the knee. What are the causes? This condition may be caused by:  A direct hit (trauma) to the knee.  Overextending the knee.  Twisting the knee.  What increases the risk? This condition is more likely to develop in people who:  Participate in certain sports, including: ? Contact sports, such as football, rugby, and lacrosse. ? Sports that take place on uneven ground, such as soccer and cross country. ? Sports that involve quick changes in position, such as basketball, dancing, gymnastics, and skiing.  Have poor strength or flexibility.  Are overweight.  Have overly flexible joints (joint laxity).  What are the signs or symptoms? Symptoms of this condition include:  Swelling.  Pain with movement.  Pain when the injured area is touched.  Instability.  A popping sound that happens at the time of injury.  Inability to stand or use the injured knee to support (bear) one's body weight.  How is this diagnosed? This condition is usually diagnosed with a physical exam. You may also have imaging tests, such as:  An X-ray.  MRI.  How is this treated? This condition may be treated with:  Ice applied to the affected area.  Medicines for pain.  Placing the knee in a brace or splint to prevent movement.  Physical therapy to help strengthen and stabilize the knee joint.  Surgery to reconstruct a torn ligament. This may be done in severe cases.  Follow these instructions at home: If you have a splint or brace:  Wear the splint or brace as told by your health care provider. Remove it only as  told by your health care provider.  Loosen the splint or brace if your toes become numb and tingle, or if they turn cold and blue.  Follow instructions from your health care provider about how to care for your splint or brace.  Keep the splint or brace clean. Managing pain, stiffness, and swelling  If directed, apply ice to the injured area. ? Put ice in a plastic bag. ? Place a towel between your skin and the bag. ? Leave the ice on for 20 minutes, 2-3 times per day.  Move your toes often to avoid stiffness and to lessen swelling.  Raise (elevate) the injured area above the level of your heart while you are sitting or lying down. Driving  Do not drive or operate heavy machinery while taking prescription pain medicine.  Ask your health care provider when it is safe to drive if you have a splint or brace on your knee. Activity  Return to your normal activities as told by your health care provider. Ask your health care provider what activities are safe for you.  Perform exercises daily as told by your health care provider or physical therapist. General instructions  Take over-the-counter and prescription medicines only as told by your health care provider.  Do not take baths, swim, or use a hot tub until your health care provider approves. Ask your health care provider if you can take showers. You may only be allowed to take sponge baths for bathing.  Do not use the injured  limb to support your body weight until your health care provider says that you can.  Keep all follow-up visits as told by your health care provider. This is important. Contact a health care provider if:  Your symptoms do not improve.  Your symptoms get worse.  You develop tingling or numbness in the area of your injury. Get help right away if:  You develop severe numbness or tingling in your leg or foot.  Your foot turns blue, white, or gray, and it feels cold. This information is not intended to  replace advice given to you by your health care provider. Make sure you discuss any questions you have with your health care provider. Document Released: 02/21/2005 Document Revised: 07/30/2015 Document Reviewed: 04/25/2014 Elsevier Interactive Patient Education  2018 [employer] Inc[employer]   Medial Collateral Knee Ligament Sprain, Phase I Rehab Ask your health care provider which exercises are safe for you. Do exercises exactly as told by your health care provider and adjust them as directed. It is normal to feel mild stretching, pulling, tightness, or discomfort as you do these exercises, but you should stop right away if you feel sudden pain or your pain gets worse.Do not begin these exercises until told by your health care provider. Stretching and range of motion exercises These exercises warm up your muscles and joints and improve the movement and flexibility of your knee. These exercises also help to relieve pain, numbness, and tingling. Exercise A: Knee flexion, passive 1. Start this exercise in one of these positions: ? Lying on the floor in front of an open doorway, with your left / right heel and foot lightly touching the wall. ? Lying on the floor with both feet on the wall. 2. Without using any effort, allow gravity to let your foot slide down the wall slowly until you feel a gentle stretch in the front of your left / right knee. 3. Hold this stretch for __________ seconds. 4. Return your leg to the starting position, using your healthy leg to do the work or to help if needed. Repeat __________ times. Complete this stretch __________ times a day. Exercise B: Knee flexion, active  1. Lie on your back with both knees straight. If this causes back discomfort, bend your healthy knee so your foot is flat on the floor. 2. Slowly slide your left / right heel back toward your buttocks until you feel a gentle stretch in the front of your knee or thigh. 3. Hold this position for __________  seconds. 4. Slowly slide your left / right heel back to the starting position. Repeat __________ times. Complete this exercise __________ times a day. Exercise C: Knee extension, sitting 1. Sit with your left / right heel propped on a chair, a coffee table, or a footstool. Do not have anything under your knee to support it. 2. Allow your leg muscles to relax, letting gravity straighten out your knee. Do not let your knee roll inward. You should feel a stretch behind your left / right knee. 3. If told by your health care provider, deepen the stretch by placing a __________ weight on your thigh, just above your kneecap. 4. Hold this position for __________ seconds. Repeat __________ times. Complete this stretch __________ times a day. Strengthening exercises These exercises build strength and endurance in your knee. Endurance is the ability to use your muscles for a long time, even after they get tired. Isometric exercises involve squeezing your muscles but not moving your knee. Exercise D: Quadriceps, isometric  1. Lie on your back with your left / right leg extended and your other knee bent. 2. If told by your health care provider, put a rolled towel or small pillow under your left / right knee. 3. Slowly tense the muscles in the front of your left / right thigh by pushing your knee down. You should see your kneecap slide up toward your hip or see increased dimpling just above the knee. 4. For __________ seconds, keep the muscle as tight as you can without increasing your pain. 5. Relax your muscles slowly and completely. Repeat __________ times. Complete this exercise __________ times a day. Exercise E: Hamstring, isometric  1. Lie on your back on a firm surface. 2. Bend your left / right knee about __________ degrees. You can prop your knee on a pillow if needed. 3. Dig your heel down and back into the surface as if you are trying to pull your heel toward your buttocks. Tighten the muscles  in the back of your thighs to "dig" as hard as you can without increasing any pain. 4. Hold this position for __________ seconds. 5. Relax your muscles slowly and completely. Repeat __________ times. Complete this exercise __________ times a day. This information is not intended to replace advice given to you by your health care provider. Make sure you discuss any questions you have with your health care provider. Document Released: 02/21/2005 Document Revised: 10/29/2015 Document Reviewed: 01/03/2015 Elsevier Interactive Patient Education  2018 [employer]

## 2017-01-19 ENCOUNTER — Telehealth: Payer: Self-pay | Admitting: *Deleted

## 2017-01-19 ENCOUNTER — Telehealth: Payer: Self-pay | Admitting: Family

## 2017-01-19 DIAGNOSIS — M25562 Pain in left knee: Secondary | ICD-10-CM

## 2017-01-19 NOTE — Telephone Encounter (Signed)
Will send to ortho, X-ray did show osteoarthritis and since pain continues want to rule out tear? Note ready for pick up or we can fax to his work

## 2017-01-19 NOTE — Telephone Encounter (Signed)
Aware, work excuse is ready.

## 2017-01-23 ENCOUNTER — Ambulatory Visit (INDEPENDENT_AMBULATORY_CARE_PROVIDER_SITE_OTHER): Payer: BLUE CROSS/BLUE SHIELD | Admitting: Orthopaedic Surgery

## 2017-01-23 DIAGNOSIS — M1712 Unilateral primary osteoarthritis, left knee: Secondary | ICD-10-CM

## 2017-01-23 DIAGNOSIS — Z0289 Encounter for other administrative examinations: Secondary | ICD-10-CM

## 2017-01-23 MED ORDER — BUPIVACAINE HCL 0.5 % IJ SOLN
2.0000 mL | INTRAMUSCULAR | Status: AC | PRN
  Administered 2017-01-23: 2 mL via INTRA_ARTICULAR

## 2017-01-23 MED ORDER — METHYLPREDNISOLONE ACETATE 40 MG/ML IJ SUSP
40.0000 mg | INTRAMUSCULAR | Status: AC | PRN
  Administered 2017-01-23: 40 mg via INTRA_ARTICULAR

## 2017-01-23 MED ORDER — LIDOCAINE HCL 1 % IJ SOLN
2.0000 mL | INTRAMUSCULAR | Status: AC | PRN
  Administered 2017-01-23: 2 mL

## 2017-01-23 NOTE — Progress Notes (Signed)
Office Visit Note   Patient: Nathaniel Martin           Date of Birth: 12/04/1970           MRN: [id number] Visit Date: 01/23/2017              Requested by: Dettinger, Elige RadonJoshua A, MD [address] [place], [place] [id number] PCP: Dettinger, Elige RadonJoshua A, MD   Assessment & Plan: Visit Diagnoses:  1. Unilateral primary osteoarthritis, left knee   2. Morbid obesity (HCC)     Plan: Impression is acute left knee arthritis flareup.  Cortisone injection was performed today.  We discussed the importance of weight loss.  Out of work for 1 week.  If not any better after the injection she will follow-up and would recommend MRI at that time.  We did refer him to a weight loss clinic with [facility]  Follow-Up Instructions: Return if symptoms worsen or fail to improve.   Orders:  Orders Placed This Encounter  Procedures  . Amb Ref to Medical Weight Management   No orders of the defined types were placed in this encounter.     Procedures: Large Joint Inj: L knee on 01/23/2017 2:57 PM Details: 22 G needle Medications: 2 mL bupivacaine 0.5 %; 2 mL lidocaine 1 %; 40 mg methylPREDNISolone acetate 40 MG/ML Outcome: tolerated well, no immediate complications Patient was prepped and draped in the usual sterile fashion.       Clinical Data: No additional findings.   Subjective: Chief Complaint  Patient presents with  . Left Knee - Pain    Patient is a 46 year old gentleman who comes in with 1 week history of acute left knee pain.  He does have baseline chronic pain with degenerative joint disease.  Denies any injuries.  He states that the knee feels like it wants to give out.  He has tried oral prednisone which did not help.  His pain is throughout his knee.  Does not endorse significant mechanical symptoms.  He is having to ambulate with a walker currently.  Is not been able to work due to significant pain with prolonged standing and walking.  Denies any numbness or tingling.    Review of  Systems  Constitutional: Negative.   All other systems reviewed and are negative.    Objective: Vital Signs: There were no vitals taken for this visit.  Physical Exam  Constitutional: He is oriented to person, place, and time. He appears well-developed and well-nourished.  HENT:  Head: Normocephalic and atraumatic.  Eyes: Pupils are equal, round, and reactive to light.  Neck: Neck supple.  Pulmonary/Chest: Effort normal.  Abdominal: Soft.  Musculoskeletal: Normal range of motion.  Neurological: He is alert and oriented to person, place, and time.  Skin: Skin is warm.  Psychiatric: He has a normal mood and affect. His behavior is normal. Judgment and thought content normal.  Nursing note and vitals reviewed.   Ortho Exam Left knee exam shows a trace effusion.  Collaterals and cruciates are stable.  Medial joint line tenderness. Specialty Comments:  No specialty comments available.  Imaging: No results found.   PMFS History: Patient Active Problem List   Diagnosis Date Noted  . Prediabetes 12/21/2016  . Hypertension 06/23/2016  . Hyperlipidemia LDL goal <130 06/23/2016  . Morbid obesity (HCC) 06/23/2016  . Knee problem 06/23/2016   Past Medical History:  Diagnosis Date  . Hyperlipidemia   . Hypertension     Family History  Problem Relation Age  of Onset  . Diabetes Mother   . Diabetes Father   . Cancer Father   . Diabetes Brother   . Heart disease Paternal Grandmother   . Heart attack Paternal Grandmother   . Heart disease Paternal Grandfather   . Heart attack Paternal Grandfather   . Diabetes Brother     Past Surgical History:  Procedure Laterality Date  . KIDNEY STONE SURGERY     Social History   Occupational History  . Not on file  Tobacco Use  . Smoking status: Never Smoker  . Smokeless tobacco: Never Used  Substance and Sexual Activity  . Alcohol use: No  . Drug use: No  . Sexual activity: Not Currently    Comment: married 21 years, 1  daughter, gone from house

## 2017-03-14 ENCOUNTER — Other Ambulatory Visit: Payer: Self-pay | Admitting: Family

## 2017-03-14 DIAGNOSIS — S8392XA Sprain of unspecified site of left knee, initial encounter: Secondary | ICD-10-CM

## 2017-03-18 ENCOUNTER — Other Ambulatory Visit: Payer: Self-pay | Admitting: Family Medicine

## 2017-03-18 DIAGNOSIS — I10 Essential (primary) hypertension: Secondary | ICD-10-CM

## 2017-03-18 DIAGNOSIS — E785 Hyperlipidemia, unspecified: Secondary | ICD-10-CM

## 2017-03-20 ENCOUNTER — Emergency Department (HOSPITAL_COMMUNITY)
Admission: EM | Admit: 2017-03-20 | Discharge: 2017-03-20 | Disposition: A | Discharge status: Home / Self Care | Payer: BLUE CROSS/BLUE SHIELD | Attending: Emergency Medicine | Admitting: Emergency Medicine

## 2017-03-20 ENCOUNTER — Emergency Department (HOSPITAL_COMMUNITY): Payer: BLUE CROSS/BLUE SHIELD

## 2017-03-20 ENCOUNTER — Encounter (HOSPITAL_COMMUNITY): Payer: Self-pay

## 2017-03-20 ENCOUNTER — Other Ambulatory Visit: Payer: Self-pay

## 2017-03-20 DIAGNOSIS — Y92009 Unspecified place in unspecified non-institutional (private) residence as the place of occurrence of the external cause: Secondary | ICD-10-CM | POA: Diagnosis not present

## 2017-03-20 DIAGNOSIS — W109XXA Fall (on) (from) unspecified stairs and steps, initial encounter: Secondary | ICD-10-CM | POA: Insufficient documentation

## 2017-03-20 DIAGNOSIS — Y999 Unspecified external cause status: Secondary | ICD-10-CM | POA: Insufficient documentation

## 2017-03-20 DIAGNOSIS — Y939 Activity, unspecified: Secondary | ICD-10-CM | POA: Diagnosis not present

## 2017-03-20 DIAGNOSIS — S40022A Contusion of left upper arm, initial encounter: Secondary | ICD-10-CM | POA: Diagnosis not present

## 2017-03-20 DIAGNOSIS — S40021A Contusion of right upper arm, initial encounter: Secondary | ICD-10-CM | POA: Diagnosis not present

## 2017-03-20 DIAGNOSIS — M79671 Pain in right foot: Secondary | ICD-10-CM | POA: Diagnosis not present

## 2017-03-20 DIAGNOSIS — Z79899 Other long term (current) drug therapy: Secondary | ICD-10-CM | POA: Diagnosis not present

## 2017-03-20 DIAGNOSIS — W19XXXA Unspecified fall, initial encounter: Secondary | ICD-10-CM

## 2017-03-20 DIAGNOSIS — M545 Low back pain: Secondary | ICD-10-CM | POA: Diagnosis not present

## 2017-03-20 DIAGNOSIS — M25552 Pain in left hip: Secondary | ICD-10-CM | POA: Insufficient documentation

## 2017-03-20 DIAGNOSIS — I10 Essential (primary) hypertension: Secondary | ICD-10-CM | POA: Diagnosis not present

## 2017-03-20 DIAGNOSIS — S8392XA Sprain of unspecified site of left knee, initial encounter: Secondary | ICD-10-CM | POA: Insufficient documentation

## 2017-03-20 DIAGNOSIS — S8011XA Contusion of right lower leg, initial encounter: Secondary | ICD-10-CM | POA: Diagnosis not present

## 2017-03-20 DIAGNOSIS — S79912A Unspecified injury of left hip, initial encounter: Secondary | ICD-10-CM | POA: Diagnosis not present

## 2017-03-20 DIAGNOSIS — M25462 Effusion, left knee: Secondary | ICD-10-CM | POA: Diagnosis not present

## 2017-03-20 DIAGNOSIS — M7989 Other specified soft tissue disorders: Secondary | ICD-10-CM | POA: Diagnosis not present

## 2017-03-20 DIAGNOSIS — S8992XA Unspecified injury of left lower leg, initial encounter: Secondary | ICD-10-CM | POA: Diagnosis present

## 2017-03-20 MED ORDER — HYDROCODONE-ACETAMINOPHEN 5-325 MG PO TABS: 1.0000 | ORAL_TABLET | Freq: Four times a day (QID) | ORAL | 0 refills | Status: DC | PRN

## 2017-03-20 MED ORDER — IBUPROFEN 400 MG PO TABS
400.0000 mg | ORAL_TABLET | Freq: Once | ORAL | Status: AC | PRN
  Administered 2017-03-20: 400 mg via ORAL
  Filled 2017-03-20: qty 1

## 2017-03-20 MED ORDER — HYDROCODONE-ACETAMINOPHEN 5-325 MG PO TABS
2.0000 | ORAL_TABLET | Freq: Once | ORAL | Status: AC
  Administered 2017-03-20: 2 via ORAL
  Filled 2017-03-20: qty 2

## 2017-03-20 NOTE — ED Provider Notes (Signed)
[facility] EMERGENCY DEPARTMENT Provider Note   CSN: [id number] Arrival date & time: 03/20/17  4098     History   Chief Complaint Chief Complaint  Patient presents with  . Fall    HPI Nathaniel Martin is a 47 y.o. male.  Patient fell 9 PM yesterday, down 4 or 5 steps in his home.  Pain was mild initially and he was ambulatory after the fall.  He became more sore as the night wore on.  He complains of left hip pain left knee pain right foot pain and low nonradiating back pain since the event.  He has had trouble walking this morning.  Pain is worse with weightbearing.  No other injury.  He denies ankle pain.  He denies other injury.  He felt well prior to the event.  Treat himself with ibuprofen,, with partial relief.  Pain is mild at present.   HPI  Past Medical History:  Diagnosis Date  . Hyperlipidemia   . Hypertension     Patient Active Problem List   Diagnosis Date Noted  . Prediabetes 12/21/2016  . Hypertension 06/23/2016  . Hyperlipidemia LDL goal <130 06/23/2016  . Morbid obesity (HCC) 06/23/2016  . Knee problem 06/23/2016    Past Surgical History:  Procedure Laterality Date  . KIDNEY STONE SURGERY         Home Medications    Prior to Admission medications   Medication Sig Start Date End Date Taking? Authorizing Provider  acetaminophen (TYLENOL) 500 MG tablet Take 1,000 mg by mouth every 6 (six) hours as needed.    [provider]  amLODipine (NORVASC) 5 MG tablet TAKE 1 TABLET (5 MG TOTAL) BY MOUTH DAILY. 03/20/17   Dettinger, Elige Radon, MD  ibuprofen (ADVIL,MOTRIN) 400 MG tablet Take 400 mg by mouth every 6 (six) hours as needed.    [provider]  losartan (COZAAR) 25 MG tablet TAKE 1 TABLET (25 MG TOTAL) BY MOUTH DAILY. 03/20/17   Dettinger, Elige Radon, MD  naproxen (NAPROSYN) 500 MG tablet TAKE 1 TABLET (500 MG TOTAL) 2 (TWO) TIMES DAILY WITH A MEAL BY MOUTH. 03/15/17   Dettinger, Elige Radon, MD  pravastatin (PRAVACHOL)  40 MG tablet TAKE 1 TABLET (40 MG TOTAL) BY MOUTH DAILY. 03/20/17   Dettinger, Elige Radon, MD  predniSONE (STERAPRED UNI-PAK 21 TAB) 10 MG (21) TBPK tablet Use as directed 01/16/17   Junie Spencer, FNP    Family History Family History  Problem Relation Age of Onset  . Diabetes Mother   . Diabetes Father   . Cancer Father   . Diabetes Brother   . Heart disease Paternal Grandmother   . Heart attack Paternal Grandmother   . Heart disease Paternal Grandfather   . Heart attack Paternal Grandfather   . Diabetes Brother     Social History Social History   Tobacco Use  . Smoking status: Never Smoker  . Smokeless tobacco: Never Used  Substance Use Topics  . Alcohol use: No  . Drug use: No     Allergies   Patient has no known allergies.   Review of Systems Review of Systems  Constitutional: Negative.   HENT: Negative.   Respiratory: Negative.   Cardiovascular: Negative.   Gastrointestinal: Negative.   Musculoskeletal: Positive for arthralgias and gait problem.  Skin: Negative.   Psychiatric/Behavioral: Negative.   All other systems reviewed and are negative.    Physical Exam Updated Vital Signs BP (!) 137/93 (BP Location: Right Arm)  Pulse 79   Temp 97.7 F (36.5 C) (Oral)   Resp 20   Ht 5\' 11"  (1.803 m)   Wt (!) 181.4 kg (400 lb)   SpO2 97%   BMI 55.79 kg/m   Physical Exam  Constitutional: He is oriented to person, place, and time. He appears well-developed and well-nourished.  HENT:  Head: Normocephalic and atraumatic.  Eyes: Conjunctivae are normal. Pupils are equal, round, and reactive to light.  Neck: Neck supple. No tracheal deviation present. No thyromegaly present.  Cardiovascular: Normal rate and regular rhythm.  No murmur heard. Pulmonary/Chest: Effort normal and breath sounds normal.  Abdominal: Soft. Bowel sounds are normal. He exhibits no distension. There is no tenderness.  Morbidly obese  Musculoskeletal: Normal range of motion. He  exhibits no edema or tenderness.  Cervical and thoracic spine nontender.  Mildly tender over lumbar spine.  Pelvis stable, nontender.  Left lower extremity.  He is mildly tender at the knee.  He is nontender at the hip.  He has no pain on internal or external rotation of the thigh.  Skin intact.  DP pulse 2+.  Good capillary refill.  Right lower extremity ecchymotic over the dorsum of the foot with mild corresponding tenderness.  Ankle is nontender.  Hip knee thigh and lower leg nontender.  Good capillary refill.  DP pulse 2+.  Bilateral upper extremities or contusion abrasion or tenderness neurovascular intact.  Neurological: He is alert and oriented to person, place, and time. No cranial nerve deficit. Coordination normal.  Skin: Skin is warm and dry. No rash noted.  Psychiatric: He has a normal mood and affect.  Nursing note and vitals reviewed.    ED Treatments / Results  Labs (all labs ordered are listed, but only abnormal results are displayed) Labs Reviewed - No data to display  EKG  EKG Interpretation None       Radiology Dg Knee Complete 4 Views Left  Result Date: 03/20/2017 CLINICAL DATA:  47 year old male post fall. Left knee pain. Initial encounter. EXAM: LEFT KNEE - COMPLETE 4+ VIEW COMPARISON:  01/16/2017 plain film exam left knee. FINDINGS: No fracture or dislocation. Marked medial tibiofemoral joint degenerative changes including left medial femoral condyle weight-bearing subchondral lucency/flattening with surrounding sclerosis suggestive of osteochondral defect. Moderate-to-marked patellofemoral joint degenerative changes. Moderate-size joint effusion without fat fluid level. IMPRESSION: Marked medial tibiofemoral joint space and moderate to marked patellofemoral joint degenerative changes. Moderate size suprapatellar joint effusion without evidence of acute fracture or dislocation. Electronically Signed   By: Lacy DuverneySteven  Olson M.D.   On: 03/20/2017 08:02   Dg Foot Complete  Right  Result Date: 03/20/2017 CLINICAL DATA:  47 year old male post fall. Swelling top of right foot. Initial encounter. EXAM: RIGHT FOOT COMPLETE - 3+ VIEW COMPARISON:  None. FINDINGS: No fracture or dislocation. Degenerative changes base of the second through fifth metatarsal tarsal articulation. Degenerative changes tibiotalar articulation. Moderate-size plantar spur. Prominent bony overgrowth Achilles tendon insertion site. IMPRESSION: No fracture or dislocation. Degenerative changes as detailed above. Electronically Signed   By: Lacy DuverneySteven  Olson M.D.   On: 03/20/2017 08:06   Dg Hip Unilat With Pelvis 2-3 Views Left  Result Date: 03/20/2017 CLINICAL DATA:  Pain following fall EXAM: DG HIP (WITH OR WITHOUT PELVIS) 2-3V LEFT COMPARISON:  None. FINDINGS: Frontal pelvis as well as frontal and lateral left hip images were obtained. There is no evident fracture or dislocation. Joint spaces appear normal. No erosive change. IMPRESSION: No fracture or dislocation.  No evident arthropathy. Electronically Signed  By: Bretta Bang III M.D.   On: 03/20/2017 07:55    Procedures Procedures (including critical care time)  Medications Ordered in ED Medications  ibuprofen (ADVIL,MOTRIN) tablet 400 mg (400 mg Oral Given 03/20/17 2130)     Initial Impression / Assessment and Plan / ED Course  I have reviewed the triage vital signs and the nursing notes.  Pertinent labs & imaging results that were available during my care of the patient were reviewed by me and considered in my medical decision making (see chart for details).     Initially declined pain medicine.  At 10:20 AM patient is able to walk with a walker, with limp favoring left leg. I do not feel the patient needs further imaging of hip. I do not feel that he would benefit from knee immobilizer.  He states his knee is more comfortable when it is slightly flexed. He will receive Norco prior to discharge.  Plan prescription Norco.[place]  Controlled Substance reporting System queried  He seen by [place] [facility] in the past he is encouraged to see [place] if significant pain or difficulty walking 3 or 4 days. Patient has a walker at home that he can use Final Clinical Impressions(s) / ED Diagnoses  Diagnoses #1 fall #2 contusions multiple sites #3 sprain of left knee #4 low back pain Final diagnoses:  None    ED Discharge Orders    None       Doug Sou, MD 03/20/17 1028

## 2017-03-20 NOTE — ED Triage Notes (Signed)
Per Pt, Pt is coming from home with complaints of a mechanical fall that happened last night as he was going up the stairs into his house. Reports hitting his left knee and hip along with right ankle. Denies LOC or hitting head.

## 2017-03-20 NOTE — ED Notes (Signed)
Pt ambulated around nurses station with walker, tolerated well. Pt stated while ambulating, he cannot put much weight on his left leg.

## 2017-03-20 NOTE — Discharge Instructions (Signed)
Use your walker.  Place an ice pack on your left knee 4 times daily 30 minutes at a time.  Take Tylenol for mild pain or the pain medicine prescribed for bad pain.  Do not take Tylenol together with the pain medicine prescribed as the combination can be dangerous to your liver.  Call [facility] to arrange to be seen if having significant pain or difficulty walking in 3 or 4 days.

## 2017-05-18 ENCOUNTER — Other Ambulatory Visit: Payer: Self-pay | Admitting: Family Medicine

## 2017-05-18 DIAGNOSIS — S8392XA Sprain of unspecified site of left knee, initial encounter: Secondary | ICD-10-CM

## 2017-06-18 ENCOUNTER — Other Ambulatory Visit: Payer: Self-pay | Admitting: Family Medicine

## 2017-06-18 DIAGNOSIS — S8392XA Sprain of unspecified site of left knee, initial encounter: Secondary | ICD-10-CM

## 2017-06-19 ENCOUNTER — Ambulatory Visit: Payer: BLUE CROSS/BLUE SHIELD | Admitting: Family Medicine

## 2017-06-19 ENCOUNTER — Encounter: Payer: Self-pay | Admitting: Family Medicine

## 2017-06-19 VITALS — BP 136/89 | HR 117 | Temp 99.1°F | Ht 71.0 in | Wt 395.0 lb

## 2017-06-19 DIAGNOSIS — L03115 Cellulitis of right lower limb: Secondary | ICD-10-CM

## 2017-06-19 MED ORDER — SULFAMETHOXAZOLE-TRIMETHOPRIM 800-160 MG PO TABS: 1.0000 | ORAL_TABLET | Freq: Two times a day (BID) | ORAL | 0 refills | Status: DC

## 2017-06-19 NOTE — Progress Notes (Signed)
BP 136/89   Pulse (!) 117   Temp 99.1 F (37.3 C) (Oral)   Ht 5\' 11"  (1.803 m)   Wt (!) 395 lb (179.2 kg)   BMI 55.09 kg/m    Subjective:    Patient ID: Nathaniel Martin, male    DOB: 01/04/1971, 47 y.o.   MRN: [id number]  HPI: Nathaniel Martin is a 47 y.o. male presenting on 06/19/2017 for Redness on right lower leg (x 2 days)   HPI Swelling and redness on right leg Patient comes in complaining of swelling and redness and warmth and pain on his right leg along with achiness and headaches.  He denies any fevers or chills.  He says he started noticing the redness 2 days ago on his right leg but then it has really gotten worse over the past 2 days.  He denies any drainage or bumps but just says that the redness started and then just slowly spread.  Relevant past medical, surgical, family and social history reviewed and updated as indicated. Interim medical history since our last visit reviewed. Allergies and medications reviewed and updated.  Review of Systems  Constitutional: Negative for chills and fever.  HENT: Negative for congestion, ear pain, sinus pressure, sinus pain and sneezing.   Respiratory: Negative for cough, shortness of breath and wheezing.   Cardiovascular: Negative for chest pain and leg swelling.  Musculoskeletal: Positive for myalgias.  Skin: Positive for color change and rash.  All other systems reviewed and are negative.   Per HPI unless specifically indicated above   Allergies as of 06/19/2017   No Known Allergies     Medication List        Accurate as of 06/19/17 11:20 AM. Always use your most recent med list.          acetaminophen 500 MG tablet Commonly known as:  TYLENOL Take 1,000 mg by mouth every 6 (six) hours as needed.   amLODipine 5 MG tablet Commonly known as:  NORVASC TAKE 1 TABLET (5 MG TOTAL) BY MOUTH DAILY.   HYDROcodone-acetaminophen 5-325 MG tablet Commonly known as:  NORCO Take 1-2 tablets by mouth every 6 (six)  hours as needed for moderate pain or severe pain.   ibuprofen 400 MG tablet Commonly known as:  ADVIL,MOTRIN Take 400 mg by mouth every 6 (six) hours as needed.   losartan 25 MG tablet Commonly known as:  COZAAR TAKE 1 TABLET (25 MG TOTAL) BY MOUTH DAILY.   naproxen 500 MG tablet Commonly known as:  NAPROSYN TAKE 1 TABLET (500 MG TOTAL) 2 (TWO) TIMES DAILY WITH A MEAL BY MOUTH.   pravastatin 40 MG tablet Commonly known as:  PRAVACHOL TAKE 1 TABLET (40 MG TOTAL) BY MOUTH DAILY.   sulfamethoxazole-trimethoprim 800-160 MG tablet Commonly known as:  BACTRIM DS,SEPTRA DS Take 1 tablet by mouth 2 (two) times daily.          Objective:    BP 136/89   Pulse (!) 117   Temp 99.1 F (37.3 C) (Oral)   Ht 5\' 11"  (1.803 m)   Wt (!) 395 lb (179.2 kg)   BMI 55.09 kg/m   Wt Readings from Last 3 Encounters:  06/19/17 (!) 395 lb (179.2 kg)  03/20/17 (!) 400 lb (181.4 kg)  12/21/16 (!) 411 lb 4 oz (186.5 kg)    Physical Exam  Constitutional: He is oriented to person, place, and time. He appears well-developed and well-nourished. No distress.  Eyes: Conjunctivae are normal. No scleral  icterus.  Neurological: He is alert and oriented to person, place, and time. Coordination normal.  Skin: Skin is warm and dry. Rash noted. He is not diaphoretic. There is erythema.     Nursing note and vitals reviewed.       Assessment & Plan:   Problem List Items Addressed This Visit      Other   Morbid obesity (HCC)    Other Visit Diagnoses    Cellulitis of right lower extremity    -  Primary   Relevant Medications   sulfamethoxazole-trimethoprim (BACTRIM DS,SEPTRA DS) 800-160 MG tablet       Follow up plan: Return in about 2 weeks (around 07/03/2017), or if symptoms worsen or fail to improve, for Diabetes and hypertension.  Counseling provided for all of the vaccine components No orders of the defined types were placed in this encounter.   Arville Care, MD [facility] 06/19/2017, 11:20 AM

## 2017-06-19 NOTE — Telephone Encounter (Signed)
Last seen 01/06/17 Dr Dettinger

## 2017-06-21 ENCOUNTER — Ambulatory Visit: Payer: BLUE CROSS/BLUE SHIELD | Admitting: Family Medicine

## 2017-06-23 DIAGNOSIS — L03115 Cellulitis of right lower limb: Secondary | ICD-10-CM | POA: Diagnosis not present

## 2017-06-23 DIAGNOSIS — Z833 Family history of diabetes mellitus: Secondary | ICD-10-CM | POA: Diagnosis not present

## 2017-06-23 DIAGNOSIS — I517 Cardiomegaly: Secondary | ICD-10-CM | POA: Diagnosis not present

## 2017-06-23 DIAGNOSIS — I10 Essential (primary) hypertension: Secondary | ICD-10-CM | POA: Diagnosis not present

## 2017-06-23 DIAGNOSIS — M7731 Calcaneal spur, right foot: Secondary | ICD-10-CM | POA: Diagnosis not present

## 2017-06-23 DIAGNOSIS — M19071 Primary osteoarthritis, right ankle and foot: Secondary | ICD-10-CM | POA: Diagnosis not present

## 2017-06-23 DIAGNOSIS — Z87442 Personal history of urinary calculi: Secondary | ICD-10-CM | POA: Diagnosis not present

## 2017-06-23 DIAGNOSIS — R7303 Prediabetes: Secondary | ICD-10-CM | POA: Diagnosis not present

## 2017-06-23 DIAGNOSIS — Z823 Family history of stroke: Secondary | ICD-10-CM | POA: Diagnosis not present

## 2017-06-23 DIAGNOSIS — E1165 Type 2 diabetes mellitus with hyperglycemia: Secondary | ICD-10-CM | POA: Diagnosis not present

## 2017-06-23 DIAGNOSIS — Z809 Family history of malignant neoplasm, unspecified: Secondary | ICD-10-CM | POA: Diagnosis not present

## 2017-06-23 DIAGNOSIS — Z8489 Family history of other specified conditions: Secondary | ICD-10-CM | POA: Diagnosis not present

## 2017-06-23 DIAGNOSIS — E7800 Pure hypercholesterolemia, unspecified: Secondary | ICD-10-CM | POA: Diagnosis not present

## 2017-06-23 DIAGNOSIS — Z8249 Family history of ischemic heart disease and other diseases of the circulatory system: Secondary | ICD-10-CM | POA: Diagnosis not present

## 2017-06-23 DIAGNOSIS — Z87891 Personal history of nicotine dependence: Secondary | ICD-10-CM | POA: Diagnosis not present

## 2017-06-23 DIAGNOSIS — L03116 Cellulitis of left lower limb: Secondary | ICD-10-CM | POA: Diagnosis not present

## 2017-06-23 DIAGNOSIS — Z79899 Other long term (current) drug therapy: Secondary | ICD-10-CM | POA: Diagnosis not present

## 2017-06-23 DIAGNOSIS — Z818 Family history of other mental and behavioral disorders: Secondary | ICD-10-CM | POA: Diagnosis not present

## 2017-06-24 DIAGNOSIS — I10 Essential (primary) hypertension: Secondary | ICD-10-CM | POA: Diagnosis not present

## 2017-06-24 DIAGNOSIS — L03116 Cellulitis of left lower limb: Secondary | ICD-10-CM | POA: Diagnosis not present

## 2017-06-24 DIAGNOSIS — E785 Hyperlipidemia, unspecified: Secondary | ICD-10-CM | POA: Diagnosis not present

## 2017-06-24 DIAGNOSIS — L03115 Cellulitis of right lower limb: Secondary | ICD-10-CM | POA: Diagnosis not present

## 2017-06-30 ENCOUNTER — Other Ambulatory Visit: Payer: Self-pay | Admitting: Family Medicine

## 2017-06-30 DIAGNOSIS — E785 Hyperlipidemia, unspecified: Secondary | ICD-10-CM

## 2017-06-30 DIAGNOSIS — I10 Essential (primary) hypertension: Secondary | ICD-10-CM

## 2017-07-06 ENCOUNTER — Encounter: Payer: Self-pay | Admitting: Family Medicine

## 2017-07-06 ENCOUNTER — Ambulatory Visit: Payer: BLUE CROSS/BLUE SHIELD | Admitting: Family Medicine

## 2017-07-06 VITALS — BP 124/84 | HR 82 | Temp 97.2°F | Ht 71.0 in | Wt 399.4 lb

## 2017-07-06 DIAGNOSIS — R7303 Prediabetes: Secondary | ICD-10-CM

## 2017-07-06 DIAGNOSIS — L03119 Cellulitis of unspecified part of limb: Secondary | ICD-10-CM

## 2017-07-06 DIAGNOSIS — I10 Essential (primary) hypertension: Secondary | ICD-10-CM

## 2017-07-06 DIAGNOSIS — Z713 Dietary counseling and surveillance: Secondary | ICD-10-CM | POA: Diagnosis not present

## 2017-07-06 DIAGNOSIS — E785 Hyperlipidemia, unspecified: Secondary | ICD-10-CM | POA: Diagnosis not present

## 2017-07-06 LAB — BAYER DCA HB A1C WAIVED: HB A1C (BAYER DCA - WAIVED): 5.8 % (ref <7.0)

## 2017-07-06 NOTE — Progress Notes (Signed)
BP 124/84   Pulse 82   Temp (!) 97.2 F (36.2 C) (Oral)   Ht 5' 11" (1.803 m)   Wt (!) 399 lb 6.4 oz (181.2 kg)   BMI 55.70 kg/m    Subjective:    Patient ID: Nathaniel Martin, male    DOB: [birth year], 47 y.o.   MRN: [id number]  HPI: Nathaniel Martin is a 47 y.o. male presenting on 07/06/2017 for Hypertension; Hyperlipidemia; Cellulitis (2 week); and Pre diabetes   HPI Hypertension Patient is currently on amlodipine and losartan, and their blood pressure today is 124/84. Patient denies any lightheadedness or dizziness. Patient denies headaches, blurred vision, chest pains, shortness of breath, or weakness. Denies any side effects from medication and is content with current medication.   Hyperlipidemia Patient is coming in for recheck of his hyperlipidemia. The patient is currently taking pravastatin. They deny any issues with myalgias or history of liver damage from it. They deny any focal numbness or weakness or chest pain.   Prediabetes Patient comes in today for recheck of his diabetes. Patient has been currently taking no medication but recommended diet and weight loss and patient has been losing weight over the past year he has lost 50 pounds. Patient is currently on an ACE inhibitor/ARB. Patient has not seen an ophthalmologist this year. Patient denies any issues with their feet.   Hospital follow-up for cellulitis Patient is coming in for hospital follow-up for cellulitis as well as his normal visit.  He says he had cellulitis initially started on his right leg like he had before and then spread to the left leg, he went to the emergency department they gave him IV antibiotics for 2 days and then discharged him on Keflex.  He has finished the Keflex and he feels like the redness and warmth and pain are gone but she still has the scarring on the back of his right lower leg from where he has had some wounds from cellulitis on a previous occasion.  Relevant past medical,  surgical, family and social history reviewed and updated as indicated. Interim medical history since our last visit reviewed. Allergies and medications reviewed and updated.  Review of Systems  Constitutional: Negative for chills and fever.  Eyes: Negative for discharge.  Respiratory: Negative for shortness of breath and wheezing.   Cardiovascular: Negative for chest pain and leg swelling.  Musculoskeletal: Negative for back pain and gait problem.  Skin: Negative for color change and rash.  Neurological: Negative for dizziness, weakness and light-headedness.  All other systems reviewed and are negative.   Per HPI unless specifically indicated above   Allergies as of 07/06/2017   No Known Allergies     Medication List        Accurate as of 07/06/17  9:09 AM. Always use your most recent med list.          acetaminophen 500 MG tablet Commonly known as:  TYLENOL Take 1,000 mg by mouth every 6 (six) hours as needed.   amLODipine 5 MG tablet Commonly known as:  NORVASC TAKE 1 TABLET BY MOUTH EVERY DAY   HYDROcodone-acetaminophen 5-325 MG tablet Commonly known as:  NORCO Take 1-2 tablets by mouth every 6 (six) hours as needed for moderate pain or severe pain.   ibuprofen 400 MG tablet Commonly known as:  ADVIL,MOTRIN Take 400 mg by mouth every 6 (six) hours as needed.   losartan 25 MG tablet Commonly known as:  COZAAR TAKE 1 TABLET BY MOUTH  EVERY DAY   naproxen 500 MG tablet Commonly known as:  NAPROSYN TAKE 1 TABLET (500 MG TOTAL) 2 (TWO) TIMES DAILY WITH A MEAL BY MOUTH.   pravastatin 40 MG tablet Commonly known as:  PRAVACHOL TAKE 1 TABLET BY MOUTH EVERY DAY          Objective:    BP 124/84   Pulse 82   Temp (!) 97.2 F (36.2 C) (Oral)   Ht 5' 11" (1.803 m)   Wt (!) 399 lb 6.4 oz (181.2 kg)   BMI 55.70 kg/m   Wt Readings from Last 3 Encounters:  07/06/17 (!) 399 lb 6.4 oz (181.2 kg)  06/19/17 (!) 395 lb (179.2 kg)  03/20/17 (!) 400 lb (181.4 kg)      Physical Exam  Constitutional: He is oriented to person, place, and time. He appears well-developed and well-nourished. No distress.  Eyes: Conjunctivae are normal. Right eye exhibits no discharge. No scleral icterus.  Neck: Neck supple. No thyromegaly present.  Cardiovascular: Normal rate, regular rhythm, normal heart sounds and intact distal pulses.  No murmur heard. Pulmonary/Chest: Effort normal and breath sounds normal. No respiratory distress. He has no wheezes.  Musculoskeletal: Normal range of motion. He exhibits no edema.  Lymphadenopathy:    He has no cervical adenopathy.  Neurological: He is alert and oriented to person, place, and time. Coordination normal.  Skin: Skin is warm and dry. No rash noted. He is not diaphoretic.  Psychiatric: He has a normal mood and affect. His behavior is normal.  Nursing note and vitals reviewed.       Assessment & Plan:   Problem List Items Addressed This Visit      Cardiovascular and Mediastinum   Hypertension - Primary   Relevant Orders   CBC with Differential/Platelet   CMP14+EGFR     Other   Hyperlipidemia LDL goal <130   Relevant Orders   Lipid panel   Morbid obesity ([place])   Relevant Orders   CBC with Differential/Platelet   Prediabetes   Relevant Orders   CBC with Differential/Platelet   CMP14+EGFR   Bayer DCA Hb A1c Waived    Other Visit Diagnoses    Weight loss counseling, encounter for       Cellulitis of lower extremity, unspecified laterality       Bilateral lower extremity cellulitis hospital follow-up, patient finished antibiotic and appears the cellulitis is gone       Follow up plan: Return in about 3 months (around 10/06/2017), or if symptoms worsen or fail to improve, for Recheck weight and prediabetes.  Counseling provided for all of the vaccine components Orders Placed This Encounter  Procedures  . CBC with Differential/Platelet  . CMP14+EGFR  . Lipid panel  . Bayer [facility] Hb A1c [place], MD [facility] 07/06/2017, 9:09 AM

## 2017-07-07 LAB — CMP14+EGFR
A/G RATIO: 1.4 (ref 1.2–2.2)
ALK PHOS: 98 IU/L (ref 39–117)
ALT: 24 IU/L (ref 0–44)
AST: 26 IU/L (ref 0–40)
Albumin: 4.3 g/dL (ref 3.5–5.5)
BILIRUBIN TOTAL: 0.3 mg/dL (ref 0.0–1.2)
BUN/Creatinine Ratio: 16 (ref 9–20)
BUN: 15 mg/dL (ref 6–24)
CHLORIDE: 107 mmol/L — AB (ref 96–106)
CO2: 21 mmol/L (ref 20–29)
Calcium: 9.6 mg/dL (ref 8.7–10.2)
Creatinine, Ser: 0.91 mg/dL (ref 0.76–1.27)
GFR calc Af Amer: 116 mL/min/{1.73_m2} (ref >59)
GFR calc non Af Amer: 101 mL/min/{1.73_m2} (ref >59)
GLUCOSE: 110 mg/dL — AB (ref 65–99)
Globulin, Total: 3.1 g/dL (ref 1.5–4.5)
POTASSIUM: 4.7 mmol/L (ref 3.5–5.2)
Sodium: 142 mmol/L (ref 134–144)
Total Protein: 7.4 g/dL (ref 6.0–8.5)

## 2017-07-07 LAB — CBC WITH DIFFERENTIAL/PLATELET
BASOS ABS: 0 10*3/uL (ref 0.0–0.2)
Basos: 1 %
EOS (ABSOLUTE): 0.1 10*3/uL (ref 0.0–0.4)
Eos: 2 %
Hematocrit: 41.3 % (ref 37.5–51.0)
Hemoglobin: 13.5 g/dL (ref 13.0–17.7)
IMMATURE GRANS (ABS): 0 10*3/uL (ref 0.0–0.1)
Immature Granulocytes: 0 %
LYMPHS: 25 %
Lymphocytes Absolute: 1.5 10*3/uL (ref 0.7–3.1)
MCH: 29.7 pg (ref 26.6–33.0)
MCHC: 32.7 g/dL (ref 31.5–35.7)
MCV: 91 fL (ref 79–97)
MONOS ABS: 0.7 10*3/uL (ref 0.1–0.9)
Monocytes: 12 %
NEUTROS ABS: 3.6 10*3/uL (ref 1.4–7.0)
Neutrophils: 60 %
PLATELETS: 311 10*3/uL (ref 150–379)
RBC: 4.55 x10E6/uL (ref 4.14–5.80)
RDW: 13.6 % (ref 12.3–15.4)
WBC: 6 10*3/uL (ref 3.4–10.8)

## 2017-07-07 LAB — LIPID PANEL
CHOLESTEROL TOTAL: 121 mg/dL (ref 100–199)
Chol/HDL Ratio: 3.1 ratio (ref 0.0–5.0)
HDL: 39 mg/dL — AB (ref >39)
LDL Calculated: 69 mg/dL (ref 0–99)
Triglycerides: 67 mg/dL (ref 0–149)
VLDL CHOLESTEROL CAL: 13 mg/dL (ref 5–40)

## 2017-07-18 ENCOUNTER — Other Ambulatory Visit: Payer: Self-pay | Admitting: Family Medicine

## 2017-07-18 DIAGNOSIS — S8392XA Sprain of unspecified site of left knee, initial encounter: Secondary | ICD-10-CM

## 2017-08-16 ENCOUNTER — Other Ambulatory Visit: Payer: Self-pay | Admitting: Family Medicine

## 2017-08-16 DIAGNOSIS — S8392XA Sprain of unspecified site of left knee, initial encounter: Secondary | ICD-10-CM

## 2017-09-16 ENCOUNTER — Other Ambulatory Visit: Payer: Self-pay | Admitting: Family Medicine

## 2017-09-16 DIAGNOSIS — S8392XA Sprain of unspecified site of left knee, initial encounter: Secondary | ICD-10-CM

## 2017-09-20 ENCOUNTER — Other Ambulatory Visit: Payer: Self-pay | Admitting: Family Medicine

## 2017-09-20 DIAGNOSIS — S8392XA Sprain of unspecified site of left knee, initial encounter: Secondary | ICD-10-CM

## 2017-10-11 ENCOUNTER — Ambulatory Visit: Payer: BLUE CROSS/BLUE SHIELD | Admitting: Family Medicine

## 2017-10-23 ENCOUNTER — Ambulatory Visit: Payer: BLUE CROSS/BLUE SHIELD | Admitting: Physician Assistant

## 2017-10-23 ENCOUNTER — Encounter: Payer: Self-pay | Admitting: Physician Assistant

## 2017-10-23 VITALS — BP 130/80 | HR 85 | Temp 99.2°F | Ht 71.0 in | Wt >= 6400 oz

## 2017-10-23 DIAGNOSIS — L03119 Cellulitis of unspecified part of limb: Secondary | ICD-10-CM | POA: Diagnosis not present

## 2017-10-23 DIAGNOSIS — R600 Localized edema: Secondary | ICD-10-CM | POA: Diagnosis not present

## 2017-10-23 DIAGNOSIS — S8392XA Sprain of unspecified site of left knee, initial encounter: Secondary | ICD-10-CM | POA: Diagnosis not present

## 2017-10-23 MED ORDER — NAPROXEN 500 MG PO TABS: ORAL_TABLET | ORAL | 0 refills | Status: DC

## 2017-10-23 MED ORDER — CLINDAMYCIN HCL 300 MG PO CAPS: 300.0000 mg | ORAL_CAPSULE | Freq: Three times a day (TID) | ORAL | 0 refills | Status: DC

## 2017-10-23 MED ORDER — HYDROCHLOROTHIAZIDE 25 MG PO TABS: 25.0000 mg | ORAL_TABLET | Freq: Every day | ORAL | 0 refills | Status: DC

## 2017-10-23 NOTE — Progress Notes (Signed)
BP 130/80   Pulse 85   Temp 99.2 F (37.3 C) (Oral)   Ht 5\' 11"  (1.803 m)   Wt (!) 401 lb 9.6 oz (182.2 kg)   BMI 56.01 kg/m     Subjective:    Patient ID: Nathaniel Martin, male    DOB: 03/17/1970, 47 y.o.   MRN: [id number]  HPI: Nathaniel Martin is a 47 y.o. male presenting on 10/23/2017 for Cellulitis (bilateral legs )  Patient comes in with bilateral lowerLeg edema.  Is been going on for the last couple of days.  He states he is not had any fever or chills.  He has had this occur in the past and has he had been hospitalized for it.  We discussed some of his medications that could creating more edema.  We discussed that Norvasc can cause swelling in the extremities.  He will see his PCP next week and they may address this for change.  Also that sometimes his anti-inflammatory might be causing more fluid retention.  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  Past Medical History:  Diagnosis Date  . Hyperlipidemia   . Hypertension    Relevant past medical, surgical, family and social history reviewed and updated as indicated. Interim medical history since our last visit reviewed. Allergies and medications reviewed and updated. DATA REVIEWED: CHART IN EPIC  Family History reviewed for pertinent findings.  Review of Systems  Constitutional: Negative.  Negative for appetite change and fatigue.  Eyes: Negative for pain and visual disturbance.  Respiratory: Negative.  Negative for cough, chest tightness, shortness of breath and wheezing.   Cardiovascular: Positive for leg swelling. Negative for chest pain and palpitations.  Gastrointestinal: Negative.  Negative for abdominal pain, diarrhea, nausea and vomiting.  Genitourinary: Negative.   Skin: Positive for color change. Negative for rash.  Neurological: Negative.  Negative for weakness, numbness and headaches.  Psychiatric/Behavioral: Negative.     Allergies as of 10/23/2017   No Known Allergies     Medication List        Accurate as of 10/23/17 11:01 AM. Always use your most recent med list.          acetaminophen 500 MG tablet Commonly known as:  TYLENOL Take 1,000 mg by mouth every 6 (six) hours as needed.   amLODipine 5 MG tablet Commonly known as:  NORVASC TAKE 1 TABLET BY MOUTH EVERY DAY   clindamycin 300 MG capsule Commonly known as:  CLEOCIN Take 1 capsule (300 mg total) by mouth 3 (three) times daily.   hydrochlorothiazide 25 MG tablet Commonly known as:  HYDRODIURIL Take 1 tablet (25 mg total) by mouth daily.   ibuprofen 400 MG tablet Commonly known as:  ADVIL,MOTRIN Take 400 mg by mouth every 6 (six) hours as needed.   losartan 25 MG tablet Commonly known as:  COZAAR TAKE 1 TABLET BY MOUTH EVERY DAY   naproxen 500 MG tablet Commonly known as:  NAPROSYN TAKE 1 TABLET 2 TIMES DAILY WITH A MEAL BY MOUTH.   pravastatin 40 MG tablet Commonly known as:  PRAVACHOL TAKE 1 TABLET BY MOUTH EVERY DAY          Objective:    BP 130/80   Pulse 85   Temp 99.2 F (37.3 C) (Oral)   Ht 5\' 11"  (1.803 m)   Wt (!) 401 lb 9.6 oz (182.2 kg)   BMI 56.01 kg/m    No Known Allergies  Wt  Readings from Last 3 Encounters:  10/23/17 (!) 401 lb 9.6 oz (182.2 kg)  07/06/17 (!) 399 lb 6.4 oz (181.2 kg)  06/19/17 (!) 395 lb (179.2 kg)    Physical Exam  Constitutional: He appears well-developed and well-nourished. No distress.  HENT:  Head: Normocephalic and atraumatic.  Eyes: Pupils are equal, round, and reactive to light. Conjunctivae and EOM are normal.  Cardiovascular: Normal rate, regular rhythm and normal heart sounds.  Pulmonary/Chest: Effort normal and breath sounds normal. No respiratory distress.  Skin: Skin is warm and dry. Rash noted. Rash is macular. There is erythema.     1+ pitting edema at pretibial bilaterally Tender to touch, no skin breaks  Psychiatric: He has a normal mood and affect. His behavior is normal.  Nursing note and vitals  reviewed.       Assessment & Plan:   1. Cellulitis of lower extremity, unspecified laterality - clindamycin (CLEOCIN) 300 MG capsule; Take 1 capsule (300 mg total) by mouth 3 (three) times daily.  Dispense: 30 capsule; Refill: 0  2. Localized edema - hydrochlorothiazide (HYDRODIURIL) 25 MG tablet; Take 1 tablet (25 mg total) by mouth daily.  Dispense: 90 tablet; Refill: 0  3. Sprain of left knee, unspecified ligament, initial encounter - naproxen (NAPROSYN) 500 MG tablet; TAKE 1 TABLET 2 TIMES DAILY WITH A MEAL BY MOUTH.  Dispense: 60 tablet; Refill: 0   Continue all other maintenance medications as listed above.  Follow up plan: No follow-ups on file.  Educational handout given for survey  Remus LofflerAngel S. Boruch Manuele PA-C [facility] [address]  [place], [place] [address] [phone]   10/23/2017, 11:01 AM

## 2017-10-26 ENCOUNTER — Ambulatory Visit: Payer: BLUE CROSS/BLUE SHIELD | Admitting: Family Medicine

## 2017-11-03 ENCOUNTER — Ambulatory Visit: Payer: BLUE CROSS/BLUE SHIELD | Admitting: Family Medicine

## 2017-11-03 ENCOUNTER — Encounter: Payer: Self-pay | Admitting: Family Medicine

## 2017-11-03 VITALS — BP 147/93 | HR 87 | Temp 97.8°F | Ht 71.0 in | Wt >= 6400 oz

## 2017-11-03 DIAGNOSIS — I10 Essential (primary) hypertension: Secondary | ICD-10-CM

## 2017-11-03 DIAGNOSIS — R7303 Prediabetes: Secondary | ICD-10-CM | POA: Diagnosis not present

## 2017-11-03 DIAGNOSIS — E785 Hyperlipidemia, unspecified: Secondary | ICD-10-CM | POA: Diagnosis not present

## 2017-11-03 DIAGNOSIS — Z029 Encounter for administrative examinations, unspecified: Secondary | ICD-10-CM

## 2017-11-03 LAB — BAYER DCA HB A1C WAIVED: HB A1C (BAYER DCA - WAIVED): 5.6 % (ref <7.0)

## 2017-11-03 MED ORDER — LOSARTAN POTASSIUM 50 MG PO TABS: 50.0000 mg | ORAL_TABLET | Freq: Every day | ORAL | 2 refills | Status: DC

## 2017-11-03 NOTE — Progress Notes (Signed)
BP (!) 147/93   Pulse 87   Temp 97.8 F (36.6 C) (Oral)   Ht _0  (1.803 m)   Wt (!) 402 lb 12.8 oz (182.7 kg)   BMI 56.18 kg/m    Subjective:    Patient ID: Nathaniel Martin, male    DOB: Jan 15, 1971, 47 y.o.   MRN: [id number]  HPI: Nathaniel Martin is a 47 y.o. male presenting on 11/03/2017 for Weight Check (3 month follow up); Cellulitis (Of right lower extremity- patient was seen 8/19 and states it is better.); and prediabetes (3 month follow up)   HPI Prediabetes Patient comes in today for recheck of his diabetes. Patient has been currently taking no medication currently, we are monitoring it has been controlled with an A1c of 5.9 last time. Patient is currently on an ACE inhibitor/ARB. Patient has not seen an ophthalmologist this year. Patient denies any issues with their feet.   Hyperlipidemia Patient is coming in for recheck of his hyperlipidemia. The patient is currently taking pravastatin. They deny any issues with myalgias or history of liver damage from it. They deny any focal numbness or weakness or chest pain.   Hypertension Patient is currently on losartan and hydrochlorothiazide and amlodipine, and their blood pressure today is 147/93. Patient denies any lightheadedness or dizziness. Patient denies headaches, blurred vision, chest pains, shortness of breath, or weakness. Denies any side effects from medication and is content with current medication.   Relevant past medical, surgical, family and social history reviewed and updated as indicated. Interim medical history since our last visit reviewed. Allergies and medications reviewed and updated.  Review of Systems  Constitutional: Negative for chills and fever.  Eyes: Negative for visual disturbance.  Respiratory: Negative for shortness of breath and wheezing.   Cardiovascular: Negative for chest pain and leg swelling.  Musculoskeletal: Negative for back pain and gait problem.  Skin: Negative for rash.    Neurological: Negative for dizziness, weakness, light-headedness and numbness.  All other systems reviewed and are negative.   Per HPI unless specifically indicated above   Allergies as of 11/03/2017   No Known Allergies     Medication List        Accurate as of 11/03/17  1:30 PM. Always use your most recent med list.          acetaminophen 500 MG tablet Commonly known as:  TYLENOL Take 1,000 mg by mouth every 6 (six) hours as needed.   amLODipine 5 MG tablet Commonly known as:  NORVASC TAKE 1 TABLET BY MOUTH EVERY DAY   hydrochlorothiazide 25 MG tablet Commonly known as:  HYDRODIURIL Take 1 tablet (25 mg total) by mouth daily.   ibuprofen 400 MG tablet Commonly known as:  ADVIL,MOTRIN Take 400 mg by mouth every 6 (six) hours as needed.   losartan 50 MG tablet Commonly known as:  COZAAR Take 1 tablet (50 mg total) by mouth daily.   naproxen 500 MG tablet Commonly known as:  NAPROSYN TAKE 1 TABLET 2 TIMES DAILY WITH A MEAL BY MOUTH.   pravastatin 40 MG tablet Commonly known as:  PRAVACHOL TAKE 1 TABLET BY MOUTH EVERY DAY          Objective:    BP (!) 147/93   Pulse 87   Temp 97.8 F (36.6 C) (Oral)   Ht _1  (1.803 m)   Wt (!) 402 lb 12.8 oz (182.7 kg)   BMI 56.18 kg/m   Wt Readings from Last 3 Encounters:  11/03/17 (!) 402 lb 12.8 oz (182.7 kg)  10/23/17 (!) 401 lb 9.6 oz (182.2 kg)  07/06/17 (!) 399 lb 6.4 oz (181.2 kg)    Physical Exam  Constitutional: He is oriented to person, place, and time. He appears well-developed and well-nourished. No distress.  Eyes: Conjunctivae are normal. No scleral icterus.  Neck: Neck supple. No thyromegaly present.  Cardiovascular: Normal rate, regular rhythm, normal heart sounds and intact distal pulses.  No murmur heard. Pulmonary/Chest: Effort normal and breath sounds normal. No respiratory distress. He has no wheezes.  Musculoskeletal: Normal range of motion. He exhibits no edema.  Lymphadenopathy:     He has no cervical adenopathy.  Neurological: He is alert and oriented to person, place, and time. Coordination normal.  Skin: Skin is warm and dry. No rash noted. He is not diaphoretic.  Psychiatric: He has a normal mood and affect. His behavior is normal.  Nursing note and vitals reviewed.       Assessment & Plan:   Problem List Items Addressed This Visit      Cardiovascular and Mediastinum   Hypertension   Relevant Medications   losartan (COZAAR) 50 MG tablet   Other Relevant Orders   CMP14+EGFR     Other   Hyperlipidemia LDL goal <130 - Primary   Relevant Medications   losartan (COZAAR) 50 MG tablet   Morbid obesity (HCC)   Prediabetes   Relevant Orders   Bayer DCA Hb A1c Waived    Want to discuss phentermine today but because blood pressure is elevated we will hold off and see back in 1 month and hopefully blood pressure is better.  Blood pressures better than we will consider phentermine at that time.  Doubled losartan, continue hydrochlorothiazide and amlodipine.  Patient requesting FMLA because he has had recurrent cellulitis and doctor's visits, will write as 4 days off per month for possible cellulitis or recurrent medical issues. Follow up plan: Return in about 4 weeks (around 12/01/2017), or if symptoms worsen or fail to improve, for Prediabetes and hypertension.  Counseling provided for all of the vaccine components Orders Placed This Encounter  Procedures  . Bayer DCA Hb A1c Waived  . Sublette Janee Ureste, MD [facility] 11/03/2017, 1:30 PM

## 2017-11-04 LAB — CMP14+EGFR
A/G RATIO: 1.4 (ref 1.2–2.2)
ALK PHOS: 99 IU/L (ref 39–117)
ALT: 27 IU/L (ref 0–44)
AST: 27 IU/L (ref 0–40)
Albumin: 4.4 g/dL (ref 3.5–5.5)
BILIRUBIN TOTAL: 0.3 mg/dL (ref 0.0–1.2)
BUN / CREAT RATIO: 17 (ref 9–20)
BUN: 17 mg/dL (ref 6–24)
CHLORIDE: 101 mmol/L (ref 96–106)
CO2: 24 mmol/L (ref 20–29)
Calcium: 10.3 mg/dL — ABNORMAL HIGH (ref 8.7–10.2)
Creatinine, Ser: 0.99 mg/dL (ref 0.76–1.27)
GFR calc Af Amer: 105 mL/min/{1.73_m2} (ref >59)
GFR calc non Af Amer: 91 mL/min/{1.73_m2} (ref >59)
GLOBULIN, TOTAL: 3.2 g/dL (ref 1.5–4.5)
Glucose: 101 mg/dL — ABNORMAL HIGH (ref 65–99)
Potassium: 4.2 mmol/L (ref 3.5–5.2)
Sodium: 140 mmol/L (ref 134–144)
Total Protein: 7.6 g/dL (ref 6.0–8.5)

## 2017-11-10 DIAGNOSIS — L03115 Cellulitis of right lower limb: Secondary | ICD-10-CM | POA: Diagnosis not present

## 2017-11-10 DIAGNOSIS — Z6841 Body Mass Index (BMI) 40.0 and over, adult: Secondary | ICD-10-CM | POA: Diagnosis not present

## 2017-11-10 DIAGNOSIS — L03116 Cellulitis of left lower limb: Secondary | ICD-10-CM | POA: Diagnosis not present

## 2017-11-10 DIAGNOSIS — R600 Localized edema: Secondary | ICD-10-CM | POA: Diagnosis not present

## 2017-11-20 ENCOUNTER — Encounter: Payer: Self-pay | Admitting: Family Medicine

## 2017-11-20 ENCOUNTER — Ambulatory Visit: Payer: BLUE CROSS/BLUE SHIELD | Admitting: Family Medicine

## 2017-11-20 VITALS — BP 134/83 | HR 81 | Temp 97.1°F | Ht 71.0 in | Wt >= 6400 oz

## 2017-11-20 DIAGNOSIS — Z872 Personal history of diseases of the skin and subcutaneous tissue: Secondary | ICD-10-CM | POA: Diagnosis not present

## 2017-11-20 DIAGNOSIS — R600 Localized edema: Secondary | ICD-10-CM | POA: Diagnosis not present

## 2017-11-20 MED ORDER — HYDROCHLOROTHIAZIDE 25 MG PO TABS: 25.0000 mg | ORAL_TABLET | Freq: Two times a day (BID) | ORAL | 3 refills | Status: DC

## 2017-11-20 NOTE — Progress Notes (Addendum)
Subjective:    Patient ID: Nathaniel Martin, male    DOB: 1971/01/01, 47 y.o.   MRN: [id number]  Chief Complaint:  Cellulitis (had been on Clindamycin since 11/10/17, finished today; also using Clotrimazole/Bethamethasone cream)   HPI: Nathaniel Martin is a 47 y.o. male presenting on 11/20/2017 for Cellulitis (had been on Clindamycin since 11/10/17, finished today; also using Clotrimazole/Bethamethasone cream)   1. Bilateral lower extremity edema  Pt presents today for a follow-up from 11/10/17 Urgent Care visit. Pt had bilateral lower extremity cellulitis with edema. Pt states he completed his antibiotics today. Pt reports his legs look and feel much better. Pt states he does has one area on the back of his left leg that is weeping. He states the redness is minimal. Denies pain, fever, chills, shortness of breath, palpitations, or chest pain.  2. Morbid obesity (HC/C)  Does not watch diet.   3. History of cellulitis Prescribed clindamycin on 11/10/17. States he completed the antibiotics today. Reports the edema and redness are markedly better. Denies fever, chills, fatigue.      Relevant past medical, surgical, family and social history reviewed and updated as indicated. Interim medical history since our last visit reviewed. Allergies and medications reviewed and updated. DATA REVIEWED: CHART IN EPIC  Family History reviewed for pertinent findings.  Past Medical History:  Diagnosis Date  . Hyperlipidemia   . Hypertension     Past Surgical History:  Procedure Laterality Date  . KIDNEY STONE SURGERY      Social History   Socioeconomic History  . Marital status: Married    Spouse name: Not on file  . Number of children: Not on file  . Years of education: Not on file  . Highest education level: Not on file  Occupational History  . Not on file  Social Needs  . Financial resource strain: Not on file  . Food insecurity:    Worry: Not on file    Inability: Not on file  .  Transportation needs:    Medical: Not on file    Non-medical: Not on file  Tobacco Use  . Smoking status: Never Smoker  . Smokeless tobacco: Never Used  Substance and Sexual Activity  . Alcohol use: No  . Drug use: No  . Sexual activity: Not Currently    Comment: married 38 years, 1 daughter, gone from house  Lifestyle  . Physical activity:    Days per week: Not on file    Minutes per session: Not on file  . Stress: Not on file  Relationships  . Social connections:    Talks on phone: Not on file    Gets together: Not on file    Attends religious service: Not on file    Active member of club or organization: Not on file    Attends meetings of clubs or organizations: Not on file    Relationship status: Not on file  . Intimate partner violence:    Fear of current or ex partner: Not on file    Emotionally abused: Not on file    Physically abused: Not on file    Forced sexual activity: Not on file  Other Topics Concern  . Not on file  Social History Narrative  . Not on file    Outpatient Encounter Medications as of 11/20/2017  Medication Sig  . acetaminophen (TYLENOL) 500 MG tablet Take 1,000 mg by mouth every 6 (six) hours as needed.  Marland Kitchen amLODipine (NORVASC) 5 MG  tablet TAKE 1 TABLET BY MOUTH EVERY DAY  . ibuprofen (ADVIL,MOTRIN) 400 MG tablet Take 400 mg by mouth every 6 (six) hours as needed.  Marland Kitchen losartan (COZAAR) 50 MG tablet Take 1 tablet (50 mg total) by mouth daily.  . naproxen (NAPROSYN) 500 MG tablet TAKE 1 TABLET 2 TIMES DAILY WITH A MEAL BY MOUTH.  . pravastatin (PRAVACHOL) 40 MG tablet TAKE 1 TABLET BY MOUTH EVERY DAY  . [DISCONTINUED] hydrochlorothiazide (HYDRODIURIL) 25 MG tablet Take 1 tablet (25 mg total) by mouth daily.  . hydrochlorothiazide (HYDRODIURIL) 25 MG tablet Take 1 tablet (25 mg total) by mouth 2 (two) times daily.   No facility-administered encounter medications on file as of 11/20/2017.     No Known Allergies  Review of Systems  Constitutional:  Negative for chills, diaphoresis, fatigue, fever and unexpected weight change.  Respiratory: Negative for cough, chest tightness, shortness of breath and wheezing.   Cardiovascular: Positive for leg swelling (clear drainage from left lower extremity ). Negative for chest pain and palpitations.  Skin: Positive for color change (slight redness to bilateral lower extremities. ).  All other systems reviewed and are negative.       Objective:    BP 134/83   Pulse 81   Temp (!) 97.1 F (36.2 C) (Oral)   Ht 5' 11"  (1.803 m)   Wt (!) 414 lb (187.8 kg)   BMI 57.74 kg/m    Wt Readings from Last 3 Encounters:  11/20/17 (!) 414 lb (187.8 kg)  11/03/17 (!) 402 lb 12.8 oz (182.7 kg)  10/23/17 (!) 401 lb 9.6 oz (182.2 kg)    Physical Exam  Constitutional: Vital signs are normal. He is cooperative.  Morbidly obese  Cardiovascular: Normal rate, regular rhythm, normal heart sounds and intact distal pulses.  Pulmonary/Chest: Effort normal and breath sounds normal.  Musculoskeletal: He exhibits edema (bilateral lower extremities).  Neurological: He is alert.  Skin: Skin is warm and dry. Capillary refill takes less than 2 seconds. No cyanosis.     Bilateral 1+ pretibial pitting edema  Psychiatric: He has a normal mood and affect. His behavior is normal. Judgment and thought content normal.  Nursing note and vitals reviewed.   Results for orders placed or performed in visit on 11/03/17  Bayer DCA Hb A1c Waived  Result Value Ref Range   HB A1C (BAYER DCA - WAIVED) 5.6 <7.0 %  CMP14+EGFR  Result Value Ref Range   Glucose 101 (H) 65 - 99 mg/dL   BUN 17 6 - 24 mg/dL   Creatinine, Ser 0.99 0.76 - 1.27 mg/dL   GFR calc non Af Amer 91 >59 mL/min/1.73   GFR calc Af Amer 105 >59 mL/min/1.73   BUN/Creatinine Ratio 17 9 - 20   Sodium 140 134 - 144 mmol/L   Potassium 4.2 3.5 - 5.2 mmol/L   Chloride 101 96 - 106 mmol/L   CO2 24 20 - 29 mmol/L   Calcium 10.3 (H) 8.7 - 10.2 mg/dL   Total Protein  7.6 6.0 - 8.5 g/dL   Albumin 4.4 3.5 - 5.5 g/dL   Globulin, Total 3.2 1.5 - 4.5 g/dL   Albumin/Globulin Ratio 1.4 1.2 - 2.2   Bilirubin Total 0.3 0.0 - 1.2 mg/dL   Alkaline Phosphatase 99 39 - 117 IU/L   AST 27 0 - 40 IU/L   ALT 27 0 - 44 IU/L      Assessment & Plan:   1. Bilateral lower extremity edema Elevate legs as much  as possible. Wear compression hose. Limit NSAID use. Monitor daily blood pressures and weight. Increase HCTZ to BID. Follow-up in 2 weeks for recheck. - hydrochlorothiazide (HYDRODIURIL) 25 MG tablet; Take 1 tablet (25 mg total) by mouth 2 (two) times daily.  Dispense: 90 tablet; Refill: 3 - Compression stockings  2. Morbid obesity (HCC) Monitor diet. Encouraged exercise.   3. History of cellulitis Report any redness, increased warmth or swelling, fever, or chills.    Continue all other maintenance medications.  Follow up plan: Return in about 2 weeks (around 12/04/2017), or if symptoms worsen or fail to improve.  Educational handout given for compression hose use, edema  The above assessment and management plan was discussed with the patient. The patient verbalized understanding of and has agreed to the management plan. Patient is aware to call the clinic if symptoms persist or worsen. Patient is aware when to return to the clinic for a follow-up visit. Patient educated on when it is appropriate to go to the emergency department.   Monia Pouch, FNP-C [facility] [phone]

## 2017-11-20 NOTE — Patient Instructions (Signed)
Edema Edema is when you have too much fluid in your body or under your skin. Edema may make your legs, feet, and ankles swell up. Swelling is also common in looser tissues, like around your eyes. This is a common condition. It gets more common as you get older. There are many possible causes of edema. Eating too much salt (sodium) and being on your feet or sitting for a long time can cause edema in your legs, feet, and ankles. Hot weather may make edema worse. Edema is usually painless. Your skin may look swollen or shiny. Follow these instructions at home:  Keep the swollen body part raised (elevated) above the level of your heart when you are sitting or lying down.  Do not sit still or stand for a long time.  Do not wear tight clothes. Do not wear garters on your upper legs.  Exercise your legs. This can help the swelling go down.  Wear elastic bandages or support stockings as told by your doctor.  Eat a low-salt (low-sodium) diet to reduce fluid as told by your doctor.  Depending on the cause of your swelling, you may need to limit how much fluid you drink (fluid restriction).  Take over-the-counter and prescription medicines only as told by your doctor. Contact a doctor if:  Treatment is not working.  You have heart, liver, or kidney disease and have symptoms of edema.  You have sudden and unexplained weight gain. Get help right away if:  You have shortness of breath or chest pain.  You cannot breathe when you lie down.  You have pain, redness, or warmth in the swollen areas.  You have heart, liver, or kidney disease and get edema all of a sudden.  You have a fever and your symptoms get worse all of a sudden. Summary  Edema is when you have too much fluid in your body or under your skin.  Edema may make your legs, feet, and ankles swell up. Swelling is also common in looser tissues, like around your eyes.  Raise (elevate) the swollen body part above the level of your  heart when you are sitting or lying down.  Follow your doctor's instructions about diet and how much fluid you can drink (fluid restriction). This information is not intended to replace advice given to you by your health care provider. Make sure you discuss any questions you have with your health care provider. Document Released: 08/10/2007 Document Revised: 03/11/2016 Document Reviewed: 03/11/2016 Elsevier Interactive Patient Education  2017 [employer] How to Use Compression Stockings Compression stockings are elastic socks that squeeze the legs. They help to increase blood flow to the legs, decrease swelling in the legs, and reduce the chance of developing blood clots in the lower legs. Compression stockings are often used by people who:  Are recovering from surgery.  Have poor circulation in their legs.  Are prone to getting blood clots in their legs.  Have varicose veins.  Sit or stay in bed for long periods of time.  How to use compression stockings Before you put on your compression stockings:  Make sure that they are the correct size. If you do not know your size, ask your health care provider.  Make sure that they are clean, dry, and in good condition.  Check them for rips and tears. Do not put them on if they are ripped or torn.  Put your stockings on first thing in the morning, before you get out of bed. Keep them  on for as long as your health care provider advises. When you are wearing your stockings:  Keep them as smooth as possible. Do not allow them to bunch up. It is especially important to prevent the stockings from bunching up around your toes or behind your knees.  Do not roll the stockings downward and leave them rolled down. This can decrease blood flow to your leg.  Change them right away if they become wet or dirty.  When you take off your stockings, inspect your legs and feet. Anything that does not seem normal may require medical attention. Look  for:  Open sores.  Red spots.  Swelling.  Information and tips  Do not stop wearing your compression stockings without talking to your health care provider first.  Wash your stockings every day with mild detergent in cold or warm water. Do not use bleach. Air-dry your stockings or dry them in a clothes dryer on low heat.  Replace your stockings every 3-6 months.  If skin moisturizing is part of your treatment plan, apply lotion or cream at night so that your skin will be dry when you put on the stockings in the morning. It is harder to put the stockings on when you have lotion on your legs or feet. Contact a health care provider if: Remove your stockings and seek medical care if:  You have a feeling of pins and needles in your feet or legs.  You have any new changes in your skin.  You have skin lesions that are getting worse.  You have swelling or pain that is getting worse.  Get help right away if:  You have numbness or tingling in your lower legs that does not get better right after you take the stockings off.  Your toes or feet become cold and blue.  You develop open sores or red spots on your legs that do not go away.  You see or feel a warm spot on your leg.  You have new swelling or soreness in your leg.  You are short of breath or you have chest pain for no reason.  You have a rapid or irregular heartbeat.  You feel light-headed or dizzy. This information is not intended to replace advice given to you by your health care provider. Make sure you discuss any questions you have with your health care provider. Document Released: 12/19/2008 Document Revised: 07/22/2015 Document Reviewed: 01/29/2014 Elsevier Interactive Patient Education  [employer].

## 2017-12-06 ENCOUNTER — Ambulatory Visit: Payer: BLUE CROSS/BLUE SHIELD | Admitting: Family Medicine

## 2017-12-06 ENCOUNTER — Encounter: Payer: Self-pay | Admitting: Family Medicine

## 2017-12-06 DIAGNOSIS — I10 Essential (primary) hypertension: Secondary | ICD-10-CM

## 2017-12-06 MED ORDER — PHENTERMINE HCL 37.5 MG PO CAPS: 37.5000 mg | ORAL_CAPSULE | ORAL | 0 refills | Status: DC

## 2017-12-06 MED ORDER — TOPIRAMATE 25 MG PO TABS: 25.0000 mg | ORAL_TABLET | Freq: Every day | ORAL | 3 refills | Status: DC

## 2017-12-06 NOTE — Addendum Note (Signed)
Addended by: Arville Care on: 12/06/2017 10:47 AM   Modules accepted: Orders

## 2017-12-06 NOTE — Progress Notes (Signed)
BP 138/87   Pulse 89   Temp (!) 97.3 F (36.3 C) (Oral)   Ht 5' 11"  (1.803 m)   Wt (!) 406 lb 6.4 oz (184.3 kg)   BMI 56.68 kg/m    Subjective:    Patient ID: Nathaniel Martin, male    DOB: 04-Mar-1971, 47 y.o.   MRN: [id number]  HPI: Nathaniel Martin is a 47 y.o. male presenting on 12/06/2017 for Hypertension (1 month follow up); Hyperlipidemia; and prediabetes   HPI Hypertension Patient is currently on losartan and amlodipine and hydrochlorothiazide, he had been taking 25 mg once daily of hydrochlorothiazide noticed taking 25 twice daily, he has had increased fluid in his legs about 2 weeks ago when he saw 1 of my colleagues but that is improved now with increased fluid pill., and their blood pressure today is 138/87. Patient denies any lightheadedness or dizziness. Patient denies headaches, blurred vision, chest pains, shortness of breath, or weakness. Denies any side effects from medication and is content with current medication.   Morbid obesity and weight recheck Patient is coming in with morbid obesity and weight recheck today.  He has been wanting to restart the phentermine like he done previously but because of his blood pressures being elevated we have not been able to start it but his blood pressures look better today and he wants to go ahead and start it.  We will start also keeping some food logs and measuring portion sizing quantities to help get an idea of how much he is eating.  Relevant past medical, surgical, family and social history reviewed and updated as indicated. Interim medical history since our last visit reviewed. Allergies and medications reviewed and updated.  Review of Systems  Constitutional: Negative for chills and fever.  Respiratory: Negative for shortness of breath and wheezing.   Cardiovascular: Negative for chest pain and leg swelling (The leg swelling is back to normal).  Musculoskeletal: Negative for back pain and gait problem.  Skin:  Negative for rash.  Neurological: Negative for dizziness, weakness, light-headedness and numbness.  All other systems reviewed and are negative.   Per HPI unless specifically indicated above   Allergies as of 12/06/2017   No Known Allergies     Medication List        Accurate as of 12/06/17  8:33 AM. Always use your most recent med list.          acetaminophen 500 MG tablet Commonly known as:  TYLENOL Take 1,000 mg by mouth every 6 (six) hours as needed.   amLODipine 5 MG tablet Commonly known as:  NORVASC TAKE 1 TABLET BY MOUTH EVERY DAY   hydrochlorothiazide 25 MG tablet Commonly known as:  HYDRODIURIL Take 1 tablet (25 mg total) by mouth 2 (two) times daily.   ibuprofen 400 MG tablet Commonly known as:  ADVIL,MOTRIN Take 400 mg by mouth every 6 (six) hours as needed.   losartan 50 MG tablet Commonly known as:  COZAAR Take 1 tablet (50 mg total) by mouth daily.   naproxen 500 MG tablet Commonly known as:  NAPROSYN TAKE 1 TABLET 2 TIMES DAILY WITH A MEAL BY MOUTH.   phentermine 37.5 MG capsule Take 1 capsule (37.5 mg total) by mouth every morning.   pravastatin 40 MG tablet Commonly known as:  PRAVACHOL TAKE 1 TABLET BY MOUTH EVERY DAY          Objective:    BP 138/87   Pulse 89   Temp (!) 97.3  F (36.3 C) (Oral)   Ht 5' 11"  (1.803 m)   Wt (!) 406 lb 6.4 oz (184.3 kg)   BMI 56.68 kg/m   Wt Readings from Last 3 Encounters:  12/06/17 (!) 406 lb 6.4 oz (184.3 kg)  11/20/17 (!) 414 lb (187.8 kg)  11/03/17 (!) 402 lb 12.8 oz (182.7 kg)    Physical Exam  Constitutional: He is oriented to person, place, and time. He appears well-developed and well-nourished. No distress.  Eyes: Conjunctivae are normal. No scleral icterus.  Neck: Neck supple. No thyromegaly present.  Cardiovascular: Normal rate, regular rhythm, normal heart sounds and intact distal pulses.  No murmur heard. Pulmonary/Chest: Effort normal and breath sounds normal. No respiratory  distress. He has no wheezes.  Musculoskeletal: Normal range of motion. He exhibits edema (Trace).  Lymphadenopathy:    He has no cervical adenopathy.  Neurological: He is alert and oriented to person, place, and time. Coordination normal.  Skin: Skin is warm and dry. No rash noted. He is not diaphoretic.  Psychiatric: He has a normal mood and affect. His behavior is normal.  Nursing note and vitals reviewed.       Assessment & Plan:   Problem List Items Addressed This Visit      Cardiovascular and Mediastinum   Hypertension   Relevant Orders   BMP8+EGFR     Other   Morbid obesity ([place]) - Primary   Relevant Medications   phentermine 37.5 MG capsule   Other Relevant Orders   BMP8+EGFR     We will check basic metabolic panel because of increased and fluid pill, will start phentermine and keep a close eye on blood pressures  Follow up plan: Return in about 4 weeks (around 01/03/2018), or if symptoms worsen or fail to improve, for Phentermine and weight recheck and hypertension.  Counseling provided for all of the vaccine components Orders Placed This Encounter  Procedures  . BMP8+EGFR    Caryl Pina, MD [facility] 12/06/2017, 8:33 AM

## 2017-12-07 LAB — BMP8+EGFR
BUN / CREAT RATIO: 14 (ref 9–20)
BUN: 15 mg/dL (ref 6–24)
CHLORIDE: 98 mmol/L (ref 96–106)
CO2: 25 mmol/L (ref 20–29)
Calcium: 9.3 mg/dL (ref 8.7–10.2)
Creatinine, Ser: 1.09 mg/dL (ref 0.76–1.27)
GFR calc non Af Amer: 81 mL/min/{1.73_m2} (ref >59)
GFR, EST AFRICAN AMERICAN: 94 mL/min/{1.73_m2} (ref >59)
Glucose: 114 mg/dL — ABNORMAL HIGH (ref 65–99)
Potassium: 4.1 mmol/L (ref 3.5–5.2)
Sodium: 140 mmol/L (ref 134–144)

## 2017-12-16 ENCOUNTER — Other Ambulatory Visit: Payer: Self-pay | Admitting: Family Medicine

## 2017-12-16 DIAGNOSIS — I10 Essential (primary) hypertension: Secondary | ICD-10-CM

## 2017-12-25 ENCOUNTER — Other Ambulatory Visit: Payer: Self-pay | Admitting: Family Medicine

## 2017-12-25 DIAGNOSIS — E785 Hyperlipidemia, unspecified: Secondary | ICD-10-CM

## 2017-12-25 DIAGNOSIS — I10 Essential (primary) hypertension: Secondary | ICD-10-CM

## 2017-12-26 ENCOUNTER — Other Ambulatory Visit: Payer: Self-pay | Admitting: Family Medicine

## 2017-12-26 DIAGNOSIS — E785 Hyperlipidemia, unspecified: Secondary | ICD-10-CM

## 2017-12-26 DIAGNOSIS — I10 Essential (primary) hypertension: Secondary | ICD-10-CM

## 2018-01-04 ENCOUNTER — Ambulatory Visit: Payer: BLUE CROSS/BLUE SHIELD | Admitting: Family Medicine

## 2018-01-18 ENCOUNTER — Ambulatory Visit: Payer: BLUE CROSS/BLUE SHIELD | Admitting: Family Medicine

## 2018-01-18 ENCOUNTER — Encounter: Payer: Self-pay | Admitting: Family Medicine

## 2018-01-18 DIAGNOSIS — Z23 Encounter for immunization: Secondary | ICD-10-CM | POA: Diagnosis not present

## 2018-01-18 DIAGNOSIS — R21 Rash and other nonspecific skin eruption: Secondary | ICD-10-CM | POA: Diagnosis not present

## 2018-01-18 MED ORDER — FLUCONAZOLE 150 MG PO TABS: 150.0000 mg | ORAL_TABLET | Freq: Once | ORAL | 0 refills | Status: AC

## 2018-01-18 MED ORDER — DOXYCYCLINE HYCLATE 100 MG PO TABS: 100.0000 mg | ORAL_TABLET | Freq: Two times a day (BID) | ORAL | 0 refills | Status: DC

## 2018-01-18 MED ORDER — PREDNISONE 20 MG PO TABS: ORAL_TABLET | ORAL | 0 refills | Status: DC

## 2018-01-18 NOTE — Progress Notes (Signed)
BP 115/73   Pulse 84   Temp (!) 96.8 F (36 C) (Oral)   Ht 5\' 11"  (1.803 m)   Wt (!) 400 lb (181.4 kg)   BMI 55.79 kg/m    Subjective:    Patient ID: Nathaniel Martin, male    DOB: September 10, 1970, 47 y.o.   MRN: [id number]  HPI: Nathaniel Martin is a 47 y.o. male presenting on 01/18/2018 for Weight Check (4 week re check); Hypertension; and Rash (abd and lower back. Patient states it has been there a few months)   HPI Papular rash Patient has a fine rash on his abdomen and back and upper arms and upper legs that has been going on for the past 2 months.  He denies any drainage or pruritus with it.  He says it has not really had much change except that it will become more pink and inflamed and then will reduce again.  He denies any fevers or chills or cough or congestion.  He is the only medication he had started was phentermine but he has been off of that for the past couple weeks because he did not tolerate it.  Morbid obesity and weight recheck Patient is coming in for morbid obesity and weight recheck.  He is still down 6 pounds from where he was last month.  He did take the phentermine for couple weeks but then says he just could not tolerate it so he stopped taking it and he is trying to do it on his own again.  He is down 6 pounds and has been trying to really focus on things and keep track of things and do calorie counting.  He is trying to keep under 2000 cal for now.  Relevant past medical, surgical, family and social history reviewed and updated as indicated. Interim medical history since our last visit reviewed. Allergies and medications reviewed and updated.  Review of Systems  Constitutional: Negative for chills and fever.  Eyes: Negative for visual disturbance.  Respiratory: Negative for shortness of breath and wheezing.   Cardiovascular: Negative for chest pain and leg swelling.  Musculoskeletal: Negative for back pain and gait problem.  Skin: Positive for rash.    Neurological: Negative for dizziness, weakness, light-headedness and numbness.  All other systems reviewed and are negative.   Per HPI unless specifically indicated above   Allergies as of 01/18/2018   No Known Allergies     Medication List        Accurate as of 01/18/18  8:58 AM. Always use your most recent med list.          acetaminophen 500 MG tablet Commonly known as:  TYLENOL Take 1,000 mg by mouth every 6 (six) hours as needed.   amLODipine 5 MG tablet Commonly known as:  NORVASC TAKE 1 TABLET BY MOUTH EVERY DAY   doxycycline 100 MG tablet Commonly known as:  VIBRA-TABS Take 1 tablet (100 mg total) by mouth 2 (two) times daily. 1 po bid   fluconazole 150 MG tablet Commonly known as:  DIFLUCAN Take 1 tablet (150 mg total) by mouth once for 1 dose.   hydrochlorothiazide 25 MG tablet Commonly known as:  HYDRODIURIL Take 1 tablet (25 mg total) by mouth 2 (two) times daily.   ibuprofen 400 MG tablet Commonly known as:  ADVIL,MOTRIN Take 400 mg by mouth every 6 (six) hours as needed.   losartan 25 MG tablet Commonly known as:  COZAAR TAKE 1 TABLET BY MOUTH EVERY DAY  naproxen 500 MG tablet Commonly known as:  NAPROSYN TAKE 1 TABLET 2 TIMES DAILY WITH A MEAL BY MOUTH.   pravastatin 40 MG tablet Commonly known as:  PRAVACHOL TAKE 1 TABLET BY MOUTH EVERY DAY   predniSONE 20 MG tablet Commonly known as:  DELTASONE 2 po at same time daily for 5 days          Objective:    BP 115/73   Pulse 84   Temp (!) 96.8 F (36 C) (Oral)   Ht 5\' 11"  (1.803 m)   Wt (!) 400 lb (181.4 kg)   BMI 55.79 kg/m   Wt Readings from Last 3 Encounters:  01/18/18 (!) 400 lb (181.4 kg)  12/06/17 (!) 406 lb 6.4 oz (184.3 kg)  11/20/17 (!) 414 lb (187.8 kg)    Physical Exam  Constitutional: He is oriented to person, place, and time. He appears well-developed and well-nourished. No distress.  Eyes: Conjunctivae are normal. No scleral icterus.  Neurological: He is  alert and oriented to person, place, and time. Coordination normal.  Skin: Skin is warm and dry. Rash (Fine pruritic rough rash that blanches that is papular and pink in nature over her abdomen and back and upper arms and upper legs.) noted. He is not diaphoretic.  Psychiatric: He has a normal mood and affect. His behavior is normal.  Nursing note and vitals reviewed.     Assessment & Plan:   Problem List Items Addressed This Visit      Other   Morbid obesity (HCC) - Primary    Other Visit Diagnoses    Papular rash, generalized       Relevant Medications   doxycycline (VIBRA-TABS) 100 MG tablet   predniSONE (DELTASONE) 20 MG tablet   fluconazole (DIFLUCAN) 150 MG tablet      We think the rash may either be a drug reaction versus a folliculitis.  We will treat with Doxy and prednisone see if improves, patient also has some yeast dermatitis under both of his breasts and axilla Follow up plan: Return in about 8 weeks (around 03/15/2018), or if symptoms worsen or fail to improve, for Recheck hypertension.  Counseling provided for all of the vaccine components No orders of the defined types were placed in this encounter.   Arville CareJoshua Dondi Aime, MD [facility] 01/18/2018, 8:58 AM

## 2018-01-30 ENCOUNTER — Ambulatory Visit: Payer: BLUE CROSS/BLUE SHIELD | Admitting: Family

## 2018-01-30 ENCOUNTER — Encounter: Payer: Self-pay | Admitting: Family

## 2018-01-30 VITALS — BP 127/83 | HR 121 | Temp 98.7°F | Ht 71.0 in | Wt >= 6400 oz

## 2018-01-30 DIAGNOSIS — L03115 Cellulitis of right lower limb: Secondary | ICD-10-CM

## 2018-01-30 MED ORDER — CLINDAMYCIN HCL 300 MG PO CAPS: 300.0000 mg | ORAL_CAPSULE | Freq: Three times a day (TID) | ORAL | 0 refills | Status: DC

## 2018-01-30 NOTE — Progress Notes (Signed)
   Subjective:    Patient ID: Nathaniel Martin, male    DOB: 05/14/1970, 47 y.o.   MRN: [id number]  Chief Complaint  Patient presents with  . right leg cellulitis    HPI PT presents to the office today with recurrent right lower leg cellulitis. Pt has been treated multiple times this year for this recurrent cellulitis. This episode started yesterday when he noticed flu like symptoms and redness in his right lower leg. Complaining of generalized body aches of 7 out 10. Has taken tylenol with mild relief.   He states he takes HCTZ 25 mg BID. States he suppose to wear compression hose,but does not do this every day.   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    Review of Systems  Constitutional: Positive for fatigue.  Cardiovascular: Positive for leg swelling.  All other systems reviewed and are negative.      Objective:   Physical Exam  Constitutional: He is oriented to person, place, and time. He appears well-developed and well-nourished. He appears ill. No distress.  HENT:  Head: Normocephalic.  Eyes: Pupils are equal, round, and reactive to light. Right eye exhibits no discharge. Left eye exhibits no discharge.  Cardiovascular: Normal rate, regular rhythm, normal heart sounds and intact distal pulses.  No murmur heard. Pulmonary/Chest: Effort normal and breath sounds normal. No respiratory distress. He has no wheezes.  Abdominal: Soft. Bowel sounds are normal. He exhibits no distension. There is no tenderness.  Musculoskeletal: He exhibits tenderness. He exhibits no edema.  Right lower leg erythemas and warmth.  Neurological: He is alert and oriented to person, place, and time. He has normal reflexes. No cranial nerve deficit.  Skin: Skin is warm and dry. No rash noted. No erythema.  Psychiatric: He has a normal mood and affect. His behavior is normal. Judgment and thought content normal.  Vitals reviewed.   BP 127/83   Pulse (!) 121    Temp 98.7 F (37.1 C) (Oral)   Ht 5\' 11"  (1.803 m)   Wt (!) 401 lb 9.6 oz (182.2 kg)   BMI 56.01 kg/m      Assessment & Plan:  Nathaniel CurrentJonathon E Martin comes in today with chief complaint of right leg cellulitis   Diagnosis and orders addressed:  1. Cellulitis of right leg Keep leg elevated Force fluids Tylenol prn for pain and fever Will do referral to vascular as this is his fifth time in the last seven months. Discussed the importance of compression hose. He may need to start lasix? PT will follow up with PCP in 10 days.  - clindamycin (CLEOCIN) 300 MG capsule; Take 1 capsule (300 mg total) by mouth 3 (three) times daily.  Dispense: 30 capsule; Refill: 0 - Ambulatory referral to Vascular Surgery   Jannifer Rodneyhristy Cherron Blitzer, FNP

## 2018-01-30 NOTE — Patient Instructions (Signed)

## 2018-02-14 ENCOUNTER — Ambulatory Visit: Payer: BLUE CROSS/BLUE SHIELD | Admitting: Family Medicine

## 2018-02-23 ENCOUNTER — Other Ambulatory Visit: Payer: Self-pay

## 2018-02-23 DIAGNOSIS — R601 Generalized edema: Secondary | ICD-10-CM

## 2018-02-23 DIAGNOSIS — L03115 Cellulitis of right lower limb: Secondary | ICD-10-CM

## 2018-03-15 ENCOUNTER — Encounter: Payer: Self-pay | Admitting: Family Medicine

## 2018-03-15 ENCOUNTER — Ambulatory Visit: Payer: BLUE CROSS/BLUE SHIELD | Admitting: Family Medicine

## 2018-03-15 VITALS — BP 139/84 | HR 84 | Temp 96.7°F | Ht 71.0 in | Wt >= 6400 oz

## 2018-03-15 DIAGNOSIS — R7303 Prediabetes: Secondary | ICD-10-CM | POA: Diagnosis not present

## 2018-03-15 DIAGNOSIS — I10 Essential (primary) hypertension: Secondary | ICD-10-CM | POA: Diagnosis not present

## 2018-03-15 DIAGNOSIS — R21 Rash and other nonspecific skin eruption: Secondary | ICD-10-CM

## 2018-03-15 DIAGNOSIS — E785 Hyperlipidemia, unspecified: Secondary | ICD-10-CM

## 2018-03-15 LAB — BAYER DCA HB A1C WAIVED: HB A1C (BAYER DCA - WAIVED): 6.2 % (ref <7.0)

## 2018-03-15 MED ORDER — FLUCONAZOLE 150 MG PO TABS: 150.0000 mg | ORAL_TABLET | ORAL | 0 refills | Status: DC

## 2018-03-15 MED ORDER — PRAVASTATIN SODIUM 40 MG PO TABS: 40.0000 mg | ORAL_TABLET | Freq: Every day | ORAL | 3 refills | Status: DC

## 2018-03-15 NOTE — Progress Notes (Signed)
BP 139/84   Pulse 84   Temp (!) 96.7 F (35.9 C) (Oral)   Ht 5' 11"  (1.803 m)   Wt (!) 415 lb 3.2 oz (188.3 kg)   BMI 57.91 kg/m    Subjective:    Patient ID: Nathaniel Martin, male    DOB: 11-Nov-1970, 48 y.o.   MRN: [id number]  HPI: Nathaniel Martin is a 48 y.o. male presenting on 03/15/2018 for Cellulitis (Right lower leg- 2 month follow up. Patient states it is better.); Hypertension; and prediabetes   HPI Prediabetes Patient comes in today for recheck of his diabetes. Patient has been currently taking no medication currently and is on diet control and has been running good with an A1c in the 5 range. Patient is currently on an ACE inhibitor/ARB. Patient has not seen an ophthalmologist this year. Patient denies any issues with their feet.   Hypertension Patient is currently on amlodipine and hydrochlorothiazide and losartan, and their blood pressure today is 139/84. Patient denies any lightheadedness or dizziness. Patient denies headaches, blurred vision, chest pains, shortness of breath, or weakness. Denies any side effects from medication and is content with current medication.   Hyperlipidemia Patient is coming in for recheck of his hyperlipidemia. The patient is currently taking pravastatin. They deny any issues with myalgias or history of liver damage from it. They deny any focal numbness or weakness or chest pain.   Rash Patient has continued rash over his abdomen and under his breasts that did improve slightly with 1 dose of Diflucan but they came back after he was on antibiotics.  He says is pruritic and he needs is over the same areas including his abdomen and under his breasts and upper thighs that had been previously but it is just not resolving.  Relevant past medical, surgical, family and social history reviewed and updated as indicated. Interim medical history since our last visit reviewed. Allergies and medications reviewed and updated.  Review of Systems    Constitutional: Negative for chills and fever.  Eyes: Negative for visual disturbance.  Respiratory: Negative for shortness of breath and wheezing.   Cardiovascular: Negative for chest pain and leg swelling.  Gastrointestinal: Negative for abdominal distention, abdominal pain, constipation, diarrhea, nausea and vomiting.  Musculoskeletal: Negative for back pain and gait problem.  Skin: Positive for rash.  Neurological: Negative for dizziness, weakness and light-headedness.  All other systems reviewed and are negative.   Per HPI unless specifically indicated above   Allergies as of 03/15/2018   No Known Allergies     Medication List       Accurate as of March 15, 2018  8:41 AM. Always use your most recent med list.        acetaminophen 500 MG tablet Commonly known as:  TYLENOL Take 1,000 mg by mouth every 6 (six) hours as needed.   amLODipine 5 MG tablet Commonly known as:  NORVASC TAKE 1 TABLET BY MOUTH EVERY DAY   hydrochlorothiazide 25 MG tablet Commonly known as:  HYDRODIURIL Take 1 tablet (25 mg total) by mouth 2 (two) times daily.   ibuprofen 400 MG tablet Commonly known as:  ADVIL,MOTRIN Take 400 mg by mouth every 6 (six) hours as needed.   losartan 25 MG tablet Commonly known as:  COZAAR TAKE 1 TABLET BY MOUTH EVERY DAY   pravastatin 40 MG tablet Commonly known as:  PRAVACHOL Take 1 tablet (40 mg total) by mouth daily.  Objective:    BP 139/84   Pulse 84   Temp (!) 96.7 F (35.9 C) (Oral)   Ht 5' 11"  (1.803 m)   Wt (!) 415 lb 3.2 oz (188.3 kg)   BMI 57.91 kg/m   Wt Readings from Last 3 Encounters:  03/15/18 (!) 415 lb 3.2 oz (188.3 kg)  01/30/18 (!) 401 lb 9.6 oz (182.2 kg)  01/18/18 (!) 400 lb (181.4 kg)    Physical Exam Vitals signs and nursing note reviewed.  Constitutional:      General: He is not in acute distress.    Appearance: He is well-developed. He is not diaphoretic.  Eyes:     General: No scleral icterus.     Conjunctiva/sclera: Conjunctivae normal.  Neck:     Musculoskeletal: Neck supple.     Thyroid: No thyromegaly.  Cardiovascular:     Rate and Rhythm: Normal rate and regular rhythm.     Heart sounds: Normal heart sounds. No murmur.  Pulmonary:     Effort: Pulmonary effort is normal. No respiratory distress.     Breath sounds: Normal breath sounds. No wheezing.  Musculoskeletal: Normal range of motion.  Lymphadenopathy:     Cervical: No cervical adenopathy.  Skin:    General: Skin is warm and dry.     Findings: Rash (Fine papular rash with excoriations scattered over chest abdomen and upper thighs) present.  Neurological:     Mental Status: He is alert and oriented to person, place, and time.     Coordination: Coordination normal.  Psychiatric:        Behavior: Behavior normal.         Assessment & Plan:   Problem List Items Addressed This Visit      Cardiovascular and Mediastinum   Hypertension - Primary   Relevant Medications   pravastatin (PRAVACHOL) 40 MG tablet   Other Relevant Orders   CMP14+EGFR     Other   Hyperlipidemia LDL goal <130   Relevant Medications   pravastatin (PRAVACHOL) 40 MG tablet   Other Relevant Orders   Lipid panel   Morbid obesity ([place])   Prediabetes   Relevant Orders   Bayer DCA Hb A1c Waived    Other Visit Diagnoses    Papular rash, generalized       Relevant Medications   fluconazole (DIFLUCAN) 150 MG tablet      Will give Diflucan for the rash and check an A1c and continue other chronic medications for now. Follow up plan: Return in about 3 months (around 06/14/2018), or if symptoms worsen or fail to improve, for Hypertension and cholesterol and prediabetes.  Counseling provided for all of the vaccine components Orders Placed This Encounter  Procedures  . Bayer DCA Hb A1c Waived  . Lipid panel  . CMP14+EGFR    Caryl Pina, MD [facility] 03/15/2018, 8:41 AM

## 2018-03-16 LAB — CMP14+EGFR
ALK PHOS: 87 IU/L (ref 39–117)
ALT: 30 IU/L (ref 0–44)
AST: 28 IU/L (ref 0–40)
Albumin/Globulin Ratio: 1.6 (ref 1.2–2.2)
Albumin: 4.7 g/dL (ref 3.5–5.5)
BUN/Creatinine Ratio: 20 (ref 9–20)
BUN: 18 mg/dL (ref 6–24)
Bilirubin Total: 0.2 mg/dL (ref 0.0–1.2)
CO2: 19 mmol/L — ABNORMAL LOW (ref 20–29)
CREATININE: 0.89 mg/dL (ref 0.76–1.27)
Calcium: 10 mg/dL (ref 8.7–10.2)
Chloride: 102 mmol/L (ref 96–106)
GFR calc Af Amer: 119 mL/min/{1.73_m2} (ref >59)
GFR calc non Af Amer: 103 mL/min/{1.73_m2} (ref >59)
GLUCOSE: 116 mg/dL — AB (ref 65–99)
Globulin, Total: 2.9 g/dL (ref 1.5–4.5)
Potassium: 4.4 mmol/L (ref 3.5–5.2)
SODIUM: 144 mmol/L (ref 134–144)
Total Protein: 7.6 g/dL (ref 6.0–8.5)

## 2018-03-16 LAB — LIPID PANEL
CHOL/HDL RATIO: 3.3 ratio (ref 0.0–5.0)
Cholesterol, Total: 144 mg/dL (ref 100–199)
HDL: 44 mg/dL (ref >39)
LDL CALC: 79 mg/dL (ref 0–99)
Triglycerides: 103 mg/dL (ref 0–149)
VLDL Cholesterol Cal: 21 mg/dL (ref 5–40)

## 2018-04-26 ENCOUNTER — Encounter: Payer: BLUE CROSS/BLUE SHIELD | Admitting: Vascular Surgery

## 2018-04-26 ENCOUNTER — Encounter (HOSPITAL_COMMUNITY): Payer: BLUE CROSS/BLUE SHIELD

## 2018-06-07 ENCOUNTER — Encounter: Payer: Self-pay | Admitting: Family Medicine

## 2018-06-07 ENCOUNTER — Ambulatory Visit (INDEPENDENT_AMBULATORY_CARE_PROVIDER_SITE_OTHER): Payer: BLUE CROSS/BLUE SHIELD | Admitting: Family Medicine

## 2018-06-07 ENCOUNTER — Other Ambulatory Visit: Payer: Self-pay

## 2018-06-07 DIAGNOSIS — I10 Essential (primary) hypertension: Secondary | ICD-10-CM | POA: Diagnosis not present

## 2018-06-07 DIAGNOSIS — M170 Bilateral primary osteoarthritis of knee: Secondary | ICD-10-CM | POA: Diagnosis not present

## 2018-06-07 DIAGNOSIS — E785 Hyperlipidemia, unspecified: Secondary | ICD-10-CM | POA: Diagnosis not present

## 2018-06-07 DIAGNOSIS — R7303 Prediabetes: Secondary | ICD-10-CM

## 2018-06-07 MED ORDER — AMLODIPINE BESYLATE 5 MG PO TABS: 5.0000 mg | ORAL_TABLET | Freq: Every day | ORAL | 1 refills | Status: DC

## 2018-06-07 MED ORDER — MELOXICAM 15 MG PO TABS: 15.0000 mg | ORAL_TABLET | Freq: Every day | ORAL | 1 refills | Status: DC

## 2018-06-07 MED ORDER — LOSARTAN POTASSIUM 25 MG PO TABS: 25.0000 mg | ORAL_TABLET | Freq: Every day | ORAL | 1 refills | Status: DC

## 2018-06-07 NOTE — Progress Notes (Signed)
Virtual Visit via telephone Note  I connected with Nathaniel Martin on 06/07/18 at 0825 by telephone and verified that I am speaking with the correct person using two identifiers. Nathaniel Martin is currently located at home and no other people are currently with her during visit. The provider, Elige Radon Dettinger, MD is located in their office at time of visit.  Call ended at [phone]  I discussed the limitations, risks, security and privacy concerns of performing an evaluation and management service by telephone and the availability of in person appointments. I also discussed with the patient that there may be a patient responsible charge related to this service. The patient expressed understanding and agreed to proceed.   History and Present Illness: Hypertension Patient is currently on amlodipine and hydrochlorothiazide and losartan, and their blood pressure is unknown today. Patient denies any lightheadedness or dizziness. Patient denies headaches, blurred vision, chest pains, shortness of breath, or weakness. Denies any side effects from medication and is content with current medication.   Hyperlipidemia Patient is coming in for recheck of his hyperlipidemia. The patient is currently taking pravastatin. They deny any issues with myalgias or history of liver damage from it. They deny any focal numbness or weakness or chest pain.   Prediabetes Patient comes in today for recheck of his diabetes. Patient has been currently taking no medication. Patient is currently on an ACE inhibitor/ARB. Patient has not seen an ophthalmologist this year. Patient denies any issues with their feet.   Patient has been having arthritis pain in both knees and has been improving but not completely resolved. Has been using ibuprofen with partial relief.   No diagnosis found.  Outpatient Encounter Medications as of 06/07/2018  Medication Sig  . acetaminophen (TYLENOL) 500 MG tablet Take 1,000 mg by mouth  every 6 (six) hours as needed.  Marland Kitchen amLODipine (NORVASC) 5 MG tablet TAKE 1 TABLET BY MOUTH EVERY DAY  . fluconazole (DIFLUCAN) 150 MG tablet Take 1 tablet (150 mg total) by mouth once a week.  . hydrochlorothiazide (HYDRODIURIL) 25 MG tablet Take 1 tablet (25 mg total) by mouth 2 (two) times daily.  Marland Kitchen ibuprofen (ADVIL,MOTRIN) 400 MG tablet Take 400 mg by mouth every 6 (six) hours as needed.  Marland Kitchen losartan (COZAAR) 25 MG tablet TAKE 1 TABLET BY MOUTH EVERY DAY  . pravastatin (PRAVACHOL) 40 MG tablet Take 1 tablet (40 mg total) by mouth daily.   No facility-administered encounter medications on file as of 06/07/2018.     Review of Systems  Constitutional: Negative for chills and fever.  Respiratory: Negative for shortness of breath and wheezing.   Cardiovascular: Negative for chest pain and leg swelling.  Musculoskeletal: Positive for arthralgias. Negative for back pain and gait problem.  Skin: Negative for rash.  All other systems reviewed and are negative.   Observations/Objective: Patient sounds comfortable and not in any acute distress on the phone  Assessment and Plan: Problem List Items Addressed This Visit      Cardiovascular and Mediastinum   Hypertension   Relevant Medications   amLODipine (NORVASC) 5 MG tablet   losartan (COZAAR) 25 MG tablet     Other   Hyperlipidemia LDL goal <130   Relevant Medications   amLODipine (NORVASC) 5 MG tablet   losartan (COZAAR) 25 MG tablet   Prediabetes - Primary    Other Visit Diagnoses    Bilateral primary osteoarthritis of knee       Relevant Medications   meloxicam (MOBIC) 15  MG tablet       Follow Up Instructions: 3 month recheck for prediabetes and hypertension and cholesterol  Started meloxicam for patient's arthritis, ibuprofen was not cutting it, instructed to take with food and do not take ibuprofen while he is taking this.    I discussed the assessment and treatment plan with the patient. The patient was provided an  opportunity to ask questions and all were answered. The patient agreed with the plan and demonstrated an understanding of the instructions.   The patient was advised to call back or seek an in-person evaluation if the symptoms worsen or if the condition fails to improve as anticipated.  The above assessment and management plan was discussed with the patient. The patient verbalized understanding of and has agreed to the management plan. Patient is aware to call the clinic if symptoms persist or worsen. Patient is aware when to return to the clinic for a follow-up visit. Patient educated on when it is appropriate to go to the emergency department.    I provided 10 minutes of non-face-to-face time during this encounter.    Nils Pyle, MD

## 2018-07-05 ENCOUNTER — Other Ambulatory Visit: Payer: Self-pay | Admitting: Family Medicine

## 2018-07-05 DIAGNOSIS — R600 Localized edema: Secondary | ICD-10-CM

## 2018-10-14 ENCOUNTER — Other Ambulatory Visit: Payer: Self-pay | Admitting: Family Medicine

## 2018-10-14 DIAGNOSIS — R600 Localized edema: Secondary | ICD-10-CM

## 2018-11-22 ENCOUNTER — Other Ambulatory Visit: Payer: Self-pay | Admitting: Family Medicine

## 2018-11-22 DIAGNOSIS — R600 Localized edema: Secondary | ICD-10-CM

## 2018-11-29 ENCOUNTER — Other Ambulatory Visit: Payer: Self-pay | Admitting: Family Medicine

## 2018-11-29 DIAGNOSIS — M170 Bilateral primary osteoarthritis of knee: Secondary | ICD-10-CM

## 2018-12-10 ENCOUNTER — Ambulatory Visit (INDEPENDENT_AMBULATORY_CARE_PROVIDER_SITE_OTHER): Payer: BC Managed Care – PPO | Admitting: Family Medicine

## 2018-12-10 ENCOUNTER — Encounter: Payer: Self-pay | Admitting: Family Medicine

## 2018-12-10 DIAGNOSIS — R7303 Prediabetes: Secondary | ICD-10-CM | POA: Diagnosis not present

## 2018-12-10 DIAGNOSIS — M170 Bilateral primary osteoarthritis of knee: Secondary | ICD-10-CM

## 2018-12-10 DIAGNOSIS — I10 Essential (primary) hypertension: Secondary | ICD-10-CM

## 2018-12-10 DIAGNOSIS — R600 Localized edema: Secondary | ICD-10-CM

## 2018-12-10 DIAGNOSIS — E785 Hyperlipidemia, unspecified: Secondary | ICD-10-CM | POA: Diagnosis not present

## 2018-12-10 MED ORDER — HYDROCHLOROTHIAZIDE 25 MG PO TABS: 25.0000 mg | ORAL_TABLET | Freq: Every day | ORAL | 3 refills | Status: DC

## 2018-12-10 MED ORDER — AMLODIPINE BESYLATE 5 MG PO TABS: 5.0000 mg | ORAL_TABLET | Freq: Every day | ORAL | 3 refills | Status: DC

## 2018-12-10 MED ORDER — LOSARTAN POTASSIUM 25 MG PO TABS: 25.0000 mg | ORAL_TABLET | Freq: Every day | ORAL | 3 refills | Status: DC

## 2018-12-10 MED ORDER — MELOXICAM 15 MG PO TABS: 15.0000 mg | ORAL_TABLET | Freq: Every day | ORAL | 3 refills | Status: DC

## 2018-12-10 MED ORDER — PRAVASTATIN SODIUM 40 MG PO TABS: 40.0000 mg | ORAL_TABLET | Freq: Every day | ORAL | 3 refills | Status: DC

## 2018-12-10 NOTE — Progress Notes (Signed)
Virtual Visit via telephone Note   I connected with Nathaniel Martin on 12/10/18 at 1300 by telephone and verified that I am speaking with the correct person using two identifiers. Nathaniel Martin is currently located at home and no other people are currently with her during visit. The provider, Fransisca Kaufmann Navi Erber, MD is located in their office at time of visit.  Call ended at 1308  I discussed the limitations, risks, security and privacy concerns of performing an evaluation and management service by telephone and the availability of in person appointments. I also discussed with the patient that there may be a patient responsible charge related to this service. The patient expressed understanding and agreed to proceed.   History and Present Illness: Hypertension Patient is currently on amlodipine and hctz and losartan, and their blood pressure today is unknown. Patient denies any lightheadedness or dizziness. Patient denies headaches, blurred vision, chest pains, shortness of breath, or weakness. Denies any side effects from medication and is content with current medication.  Hyperlipidemia Patient is coming in for recheck of his hyperlipidemia. The patient is currently taking pravastatin. They deny any issues with myalgias or history of liver damage from it. They deny any focal numbness or weakness or chest pain.    Prediabetes Patient comes in today for recheck of his diabetes. Patient has been currently taking no medication, diet controlled. Patient is currently on an ACE inhibitor/ARB. Patient has not seen an ophthalmologist this year. Patient denies any issues with their feet.   No diagnosis found.  Outpatient Encounter Medications as of 12/10/2018  Medication Sig  . acetaminophen (TYLENOL) 500 MG tablet Take 1,000 mg by mouth every 6 (six) hours as needed.  Marland Kitchen amLODipine (NORVASC) 5 MG tablet Take 1 tablet (5 mg total) by mouth daily.  . hydrochlorothiazide (HYDRODIURIL) 25 MG  tablet TAKE 1 TABLET BY MOUTH TWICE A DAY  . ibuprofen (ADVIL,MOTRIN) 400 MG tablet Take 400 mg by mouth every 6 (six) hours as needed.  Marland Kitchen losartan (COZAAR) 25 MG tablet Take 1 tablet (25 mg total) by mouth daily.  . meloxicam (MOBIC) 15 MG tablet TAKE 1 TABLET BY MOUTH EVERY DAY  . pravastatin (PRAVACHOL) 40 MG tablet Take 1 tablet (40 mg total) by mouth daily.   No facility-administered encounter medications on file as of 12/10/2018.     Review of Systems  Constitutional: Negative for chills and fever.  Respiratory: Negative for shortness of breath and wheezing.   Cardiovascular: Negative for chest pain and leg swelling.  Musculoskeletal: Negative for back pain and gait problem.  Skin: Negative for rash.  Neurological: Negative for dizziness, weakness, light-headedness and numbness.  All other systems reviewed and are negative.   Observations/Objective: Patient sounds comfortable and in no acute distress  Assessment and Plan: Problem List Items Addressed This Visit      Cardiovascular and Mediastinum   Hypertension   Relevant Medications   amLODipine (NORVASC) 5 MG tablet   hydrochlorothiazide (HYDRODIURIL) 25 MG tablet   losartan (COZAAR) 25 MG tablet   pravastatin (PRAVACHOL) 40 MG tablet   Other Relevant Orders   CBC with Differential/Platelet   CMP14+EGFR     Other   Hyperlipidemia LDL goal <130 - Primary   Relevant Medications   amLODipine (NORVASC) 5 MG tablet   hydrochlorothiazide (HYDRODIURIL) 25 MG tablet   losartan (COZAAR) 25 MG tablet   pravastatin (PRAVACHOL) 40 MG tablet   Other Relevant Orders   CBC with Differential/Platelet   Lipid panel  Morbid obesity ([place])   Prediabetes   Relevant Orders   CBC with Differential/Platelet   CMP14+EGFR   Bayer DCA Hb A1c Waived   Bilateral lower extremity edema   Relevant Medications   hydrochlorothiazide (HYDRODIURIL) 25 MG tablet    Other Visit Diagnoses    Bilateral primary osteoarthritis of knee        Relevant Medications   meloxicam (MOBIC) 15 MG tablet       Follow Up Instructions: Follow up in 3 monhts    I discussed the assessment and treatment plan with the patient. The patient was provided an opportunity to ask questions and all were answered. The patient agreed with the plan and demonstrated an understanding of the instructions.   The patient was advised to call back or seek an in-person evaluation if the symptoms worsen or if the condition fails to improve as anticipated.  The above assessment and management plan was discussed with the patient. The patient verbalized understanding of and has agreed to the management plan. Patient is aware to call the clinic if symptoms persist or worsen. Patient is aware when to return to the clinic for a follow-up visit. Patient educated on when it is appropriate to go to the emergency department.    I provided 8 minutes of non-face-to-face time during this encounter.    Worthy Rancher, MD

## 2019-03-15 ENCOUNTER — Other Ambulatory Visit: Payer: Self-pay

## 2019-03-18 ENCOUNTER — Encounter: Payer: Self-pay | Admitting: Family Medicine

## 2019-03-18 ENCOUNTER — Ambulatory Visit: Payer: BC Managed Care – PPO | Admitting: Family Medicine

## 2019-03-18 ENCOUNTER — Other Ambulatory Visit: Payer: Self-pay

## 2019-03-18 VITALS — BP 122/78 | HR 86 | Temp 97.5°F | Ht 71.0 in | Wt >= 6400 oz

## 2019-03-18 DIAGNOSIS — R600 Localized edema: Secondary | ICD-10-CM

## 2019-03-18 DIAGNOSIS — I10 Essential (primary) hypertension: Secondary | ICD-10-CM | POA: Diagnosis not present

## 2019-03-18 DIAGNOSIS — M170 Bilateral primary osteoarthritis of knee: Secondary | ICD-10-CM

## 2019-03-18 DIAGNOSIS — R7303 Prediabetes: Secondary | ICD-10-CM

## 2019-03-18 DIAGNOSIS — E785 Hyperlipidemia, unspecified: Secondary | ICD-10-CM

## 2019-03-18 LAB — BAYER DCA HB A1C WAIVED: HB A1C (BAYER DCA - WAIVED): 6 % (ref <7.0)

## 2019-03-18 MED ORDER — AMLODIPINE BESYLATE 5 MG PO TABS: 5.0000 mg | ORAL_TABLET | Freq: Every day | ORAL | 3 refills | Status: DC

## 2019-03-18 MED ORDER — MELOXICAM 15 MG PO TABS: 15.0000 mg | ORAL_TABLET | Freq: Every day | ORAL | 3 refills | Status: DC

## 2019-03-18 MED ORDER — HYDROCHLOROTHIAZIDE 25 MG PO TABS: 25.0000 mg | ORAL_TABLET | Freq: Every day | ORAL | 3 refills | Status: DC

## 2019-03-18 MED ORDER — LOSARTAN POTASSIUM 25 MG PO TABS: 25.0000 mg | ORAL_TABLET | Freq: Every day | ORAL | 3 refills | Status: DC

## 2019-03-18 MED ORDER — PRAVASTATIN SODIUM 40 MG PO TABS: 40.0000 mg | ORAL_TABLET | Freq: Every day | ORAL | 3 refills | Status: DC

## 2019-03-18 NOTE — Progress Notes (Signed)
BP 122/78   Pulse 86   Temp (!) 97.5 F (36.4 C) (Temporal)   Ht 5' 11"  (1.803 m)   Wt (!) 421 lb 9.6 oz (191.2 kg)   SpO2 94%   BMI 58.80 kg/m    Subjective:   Patient ID: Nathaniel Martin, male    DOB: 01-Sep-1970, 49 y.o.   MRN: [id number]  HPI: Nathaniel Martin is a 49 y.o. 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  Being stuck at home has been a challenge for that.  Prediabetes Patient comes in today for recheck of his diabetes. Patient has been currently taking no medication and his last A1c was 6.2, will recheck today. Patient is currently on an ACE inhibitor/ARB. Patient has not seen an ophthalmologist this year. Patient denies any issues with their feet.   Hypertension Patient is currently on amlodipine and hydrochlorothiazide and losartan, and their blood pressure today is 122/78. Patient denies any lightheadedness or dizziness. Patient denies headaches, blurred vision, chest pains, shortness of breath, or weakness. Denies any side effects from medication and is content with current medication.  Patient does get lower extremity edema but it has been stable and not to significant recently  Hyperlipidemia Patient is coming in for recheck of his hyperlipidemia. The patient is currently taking pravastatin. They deny any issues with myalgias or history of liver damage from it. They deny any focal numbness or weakness or chest pain.   Relevant past medical, surgical, family and social history reviewed and updated as indicated. Interim medical history since our last visit reviewed. Allergies and medications reviewed and updated.  Review of Systems  Constitutional: Negative for chills and fever.  Eyes: Negative for discharge.    Respiratory: Negative for shortness of breath and wheezing.   Cardiovascular: Negative for chest pain and leg swelling.  Musculoskeletal: Negative for back pain and gait problem.  Skin: Negative for rash.  Neurological: Negative for dizziness, weakness and light-headedness.  All other systems reviewed and are negative.   Per HPI unless specifically indicated above   Allergies as of 03/18/2019   No Known Allergies     Medication List       Accurate as of March 18, 2019  8:17 AM. If you have any questions, ask your nurse or doctor.        acetaminophen 500 MG tablet Commonly known as: TYLENOL Take 1,000 mg by mouth every 6 (six) hours as needed.   amLODipine 5 MG tablet Commonly known as: NORVASC Take 1 tablet (5 mg total) by mouth daily.   hydrochlorothiazide 25 MG tablet Commonly known as: HYDRODIURIL Take 1 tablet (25 mg total) by mouth daily.   ibuprofen 400 MG tablet Commonly known as: ADVIL Take 400 mg by mouth every 6 (six) hours as needed.   losartan 25 MG tablet Commonly known as: COZAAR Take 1 tablet (25 mg total) by mouth daily.   meloxicam 15 MG tablet Commonly known as: MOBIC Take 1 tablet (15 mg total) by mouth daily.   pravastatin 40 MG tablet Commonly known as: PRAVACHOL Take 1 tablet (40 mg total) by mouth daily.        Objective:   BP 122/78   Pulse 86   Temp (!) 97.5 F (  36.4 C) (Temporal)   Ht 5' 11"  (1.803 m)   Wt (!) 421 lb 9.6 oz (191.2 kg)   SpO2 94%   BMI 58.80 kg/m   Wt Readings from Last 3 Encounters:  03/18/19 (!) 421 lb 9.6 oz (191.2 kg)  03/15/18 (!) 415 lb 3.2 oz (188.3 kg)  01/30/18 (!) 401 lb 9.6 oz (182.2 kg)    Physical Exam Vitals and nursing note reviewed.  Constitutional:      General: He is not in acute distress.    Appearance: He is well-developed. He is not diaphoretic.  Eyes:     General: No scleral icterus.    Conjunctiva/sclera: Conjunctivae normal.  Neck:     Thyroid: No thyromegaly.   Cardiovascular:     Rate and Rhythm: Normal rate and regular rhythm.     Heart sounds: Normal heart sounds. No murmur.  Pulmonary:     Effort: Pulmonary effort is normal. No respiratory distress.     Breath sounds: Normal breath sounds. No wheezing.  Musculoskeletal:        General: Normal range of motion.     Cervical back: Neck supple.  Lymphadenopathy:     Cervical: No cervical adenopathy.  Skin:    General: Skin is warm and dry.     Findings: No rash.  Neurological:     Mental Status: He is alert and oriented to person, place, and time.     Coordination: Coordination normal.  Psychiatric:        Behavior: Behavior normal.       Assessment & Plan:   Problem List Items Addressed This Visit      Cardiovascular and Mediastinum   Hypertension   Relevant Medications   hydrochlorothiazide (HYDRODIURIL) 25 MG tablet   amLODipine (NORVASC) 5 MG tablet   losartan (COZAAR) 25 MG tablet   pravastatin (PRAVACHOL) 40 MG tablet   Other Relevant Orders   CMP14+EGFR     Other   Hyperlipidemia LDL goal <130   Relevant Medications   hydrochlorothiazide (HYDRODIURIL) 25 MG tablet   amLODipine (NORVASC) 5 MG tablet   losartan (COZAAR) 25 MG tablet   pravastatin (PRAVACHOL) 40 MG tablet   Other Relevant Orders   Lipid panel   Morbid obesity ([facility])   Prediabetes - Primary   Relevant Orders   hgba1c   CMP14+EGFR   Lipid panel   Bilateral lower extremity edema   Relevant Medications   hydrochlorothiazide (HYDRODIURIL) 25 MG tablet   Other Relevant Orders   CBC with Differential/Platelet    Other Visit Diagnoses    Bilateral primary osteoarthritis of knee       Relevant Medications   meloxicam (MOBIC) 15 MG tablet      Will check blood work today, discussed weight loss and really focus on that, it has been a challenge for him since has been out of work. Continue current medication pending blood work Follow up plan: Return in about 6 months (around 09/15/2019), or if  symptoms worsen or fail to improve, for Prediabetes and hypertension.  Counseling provided for all of the vaccine components Orders Placed This Encounter  Procedures  . hgba1c  . CBC with Differential/Platelet  . CMP14+EGFR  . Lipid panel    Caryl Pina, MD [facility] 03/18/2019, 8:17 AM

## 2019-03-19 LAB — CBC WITH DIFFERENTIAL/PLATELET
Basophils Absolute: 0 10*3/uL (ref 0.0–0.2)
Basos: 0 %
EOS (ABSOLUTE): 0.2 10*3/uL (ref 0.0–0.4)
Eos: 2 %
Hematocrit: 42.2 % (ref 37.5–51.0)
Hemoglobin: 14.2 g/dL (ref 13.0–17.7)
Immature Grans (Abs): 0 10*3/uL (ref 0.0–0.1)
Immature Granulocytes: 0 %
Lymphocytes Absolute: 1.7 10*3/uL (ref 0.7–3.1)
Lymphs: 22 %
MCH: 30 pg (ref 26.6–33.0)
MCHC: 33.6 g/dL (ref 31.5–35.7)
MCV: 89 fL (ref 79–97)
Monocytes Absolute: 0.8 10*3/uL (ref 0.1–0.9)
Monocytes: 10 %
Neutrophils Absolute: 5.1 10*3/uL (ref 1.4–7.0)
Neutrophils: 66 %
Platelets: 279 10*3/uL (ref 150–450)
RBC: 4.74 x10E6/uL (ref 4.14–5.80)
RDW: 12.9 % (ref 11.6–15.4)
WBC: 7.8 10*3/uL (ref 3.4–10.8)

## 2019-03-19 LAB — CMP14+EGFR
ALT: 42 IU/L (ref 0–44)
AST: 38 IU/L (ref 0–40)
Albumin/Globulin Ratio: 1.4 (ref 1.2–2.2)
Albumin: 4.3 g/dL (ref 4.0–5.0)
Alkaline Phosphatase: 93 IU/L (ref 39–117)
BUN/Creatinine Ratio: 21 — ABNORMAL HIGH (ref 9–20)
BUN: 23 mg/dL (ref 6–24)
Bilirubin Total: 0.3 mg/dL (ref 0.0–1.2)
CO2: 21 mmol/L (ref 20–29)
Calcium: 9.8 mg/dL (ref 8.7–10.2)
Chloride: 104 mmol/L (ref 96–106)
Creatinine, Ser: 1.1 mg/dL (ref 0.76–1.27)
GFR calc Af Amer: 92 mL/min/{1.73_m2} (ref >59)
GFR calc non Af Amer: 80 mL/min/{1.73_m2} (ref >59)
Globulin, Total: 3 g/dL (ref 1.5–4.5)
Glucose: 125 mg/dL — ABNORMAL HIGH (ref 65–99)
Potassium: 4.6 mmol/L (ref 3.5–5.2)
Sodium: 140 mmol/L (ref 134–144)
Total Protein: 7.3 g/dL (ref 6.0–8.5)

## 2019-03-19 LAB — LIPID PANEL
Chol/HDL Ratio: 3.7 ratio (ref 0.0–5.0)
Cholesterol, Total: 144 mg/dL (ref 100–199)
HDL: 39 mg/dL — ABNORMAL LOW (ref >39)
LDL Chol Calc (NIH): 83 mg/dL (ref 0–99)
Triglycerides: 124 mg/dL (ref 0–149)
VLDL Cholesterol Cal: 22 mg/dL (ref 5–40)

## 2019-05-30 DIAGNOSIS — Z23 Encounter for immunization: Secondary | ICD-10-CM | POA: Diagnosis not present

## 2019-06-03 ENCOUNTER — Ambulatory Visit: Payer: BLUE CROSS/BLUE SHIELD

## 2019-06-22 DIAGNOSIS — Z23 Encounter for immunization: Secondary | ICD-10-CM | POA: Diagnosis not present

## 2019-09-20 ENCOUNTER — Ambulatory Visit: Payer: BC Managed Care – PPO | Admitting: Family Medicine

## 2019-09-25 ENCOUNTER — Ambulatory Visit: Payer: BC Managed Care – PPO | Admitting: Family Medicine

## 2019-09-25 ENCOUNTER — Other Ambulatory Visit: Payer: Self-pay

## 2019-09-25 ENCOUNTER — Encounter: Payer: Self-pay | Admitting: Family Medicine

## 2019-09-25 VITALS — BP 131/80 | HR 67 | Temp 98.1°F | Ht 71.0 in | Wt >= 6400 oz

## 2019-09-25 DIAGNOSIS — R7303 Prediabetes: Secondary | ICD-10-CM

## 2019-09-25 DIAGNOSIS — E785 Hyperlipidemia, unspecified: Secondary | ICD-10-CM

## 2019-09-25 DIAGNOSIS — Z1159 Encounter for screening for other viral diseases: Secondary | ICD-10-CM | POA: Diagnosis not present

## 2019-09-25 DIAGNOSIS — I10 Essential (primary) hypertension: Secondary | ICD-10-CM

## 2019-09-25 LAB — BAYER DCA HB A1C WAIVED: HB A1C (BAYER DCA - WAIVED): 6.1 % (ref <7.0)

## 2019-09-25 MED ORDER — AMLODIPINE BESYLATE 5 MG PO TABS: 5.0000 mg | ORAL_TABLET | Freq: Every day | ORAL | 3 refills | Status: DC

## 2019-09-25 NOTE — Progress Notes (Signed)
BP 131/80   Pulse 67   Temp 98.1 F (36.7 C)   Ht 5' 11"  (1.803 m)   Wt (!) 425 lb 2 oz (192.8 kg)   SpO2 92%   BMI 59.29 kg/m    Subjective:   Patient ID: Nathaniel Martin, male    DOB: 27-Mar-1970, 49 y.o.   MRN: [id number]  HPI: Nathaniel Martin is a 49 y.o. male presenting on 09/25/2019 for Medical Management of Chronic Issues, Hyperlipidemia, Hypertension, and Prediabetes   HPI Prediabetes Patient comes in today for recheck of his diabetes. Patient has been currently taking no medication currently, last A1c was 6.0 will check today.. Patient is currently on an ACE inhibitor/ARB. Patient has not seen an ophthalmologist this year. Patient denies any issues with their feet. The symptom started onset as an adult morbid obesity and hypertension and hyperlipidemia ARE RELATED TO DM   Hyperlipidemia Patient is coming in for recheck of his hyperlipidemia. The patient is currently taking pravastatin. They deny any issues with myalgias or history of liver damage from it. They deny any focal numbness or weakness or chest pain.   Hypertension Patient is currently on amlodipine and hydrochlorothiazide and losartan, and their blood pressure today is 131/80. Patient denies any lightheadedness or dizziness. Patient denies headaches, blurred vision, chest pains, shortness of breath, or weakness. Denies any side effects from medication and is content with current medication.   Patient's hypertension and prediabetes and hyperlipidemia are more complicated by the patient's morbid obesity.  Discussed weight loss and lifestyle modification and exercise with the patient.   Relevant past medical, surgical, family and social history reviewed and updated as indicated. Interim medical history since our last visit reviewed. Allergies and medications reviewed and updated.  Review of Systems  Constitutional: Negative for chills and fever.  Eyes: Negative for visual disturbance.  Respiratory: Negative  for shortness of breath and wheezing.   Cardiovascular: Negative for chest pain and leg swelling.  Musculoskeletal: Positive for arthralgias. Negative for back pain and gait problem.  Skin: Negative for rash.  Neurological: Negative for dizziness, weakness and numbness.  All other systems reviewed and are negative.   Per HPI unless specifically indicated above   Allergies as of 09/25/2019   No Known Allergies     Medication List       Accurate as of September 25, 2019  9:17 AM. If you have any questions, ask your nurse or doctor.        acetaminophen 500 MG tablet Commonly known as: TYLENOL Take 1,000 mg by mouth every 6 (six) hours as needed.   amLODipine 5 MG tablet Commonly known as: NORVASC Take 1 tablet (5 mg total) by mouth daily.   hydrochlorothiazide 25 MG tablet Commonly known as: HYDRODIURIL Take 1 tablet (25 mg total) by mouth daily.   ibuprofen 400 MG tablet Commonly known as: ADVIL Take 400 mg by mouth every 6 (six) hours as needed.   losartan 25 MG tablet Commonly known as: COZAAR Take 1 tablet (25 mg total) by mouth daily.   meloxicam 15 MG tablet Commonly known as: MOBIC Take 1 tablet (15 mg total) by mouth daily.   pravastatin 40 MG tablet Commonly known as: PRAVACHOL Take 1 tablet (40 mg total) by mouth daily.        Objective:   BP 131/80   Pulse 67   Temp 98.1 F (36.7 C)   Ht 5' 11"  (1.803 m)   Wt (!) 425 lb 2 oz (  192.8 kg)   SpO2 92%   BMI 59.29 kg/m   Wt Readings from Last 3 Encounters:  09/25/19 (!) 425 lb 2 oz (192.8 kg)  03/18/19 (!) 421 lb 9.6 oz (191.2 kg)  03/15/18 (!) 415 lb 3.2 oz (188.3 kg)    Physical Exam Vitals and nursing note reviewed.  Constitutional:      General: He is not in acute distress.    Appearance: He is well-developed. He is not diaphoretic.  Eyes:     General: No scleral icterus.    Conjunctiva/sclera: Conjunctivae normal.  Neck:     Thyroid: No thyromegaly.  Cardiovascular:     Rate and  Rhythm: Normal rate and regular rhythm.     Heart sounds: Normal heart sounds. No murmur heard.   Pulmonary:     Effort: Pulmonary effort is normal. No respiratory distress.     Breath sounds: Normal breath sounds. No wheezing.  Musculoskeletal:        General: No swelling. Normal range of motion.     Cervical back: Neck supple.  Lymphadenopathy:     Cervical: No cervical adenopathy.  Skin:    General: Skin is warm and dry.     Findings: No rash.  Neurological:     Mental Status: He is alert and oriented to person, place, and time.     Coordination: Coordination normal.  Psychiatric:        Behavior: Behavior normal.       Assessment & Plan:   Problem List Items Addressed This Visit      Cardiovascular and Mediastinum   Hypertension - Primary   Relevant Medications   amLODipine (NORVASC) 5 MG tablet   Other Relevant Orders   CBC with Differential/Platelet   CMP14+EGFR   Lipid panel   Bayer DCA Hb A1c Waived     Other   Hyperlipidemia LDL goal <130   Relevant Medications   amLODipine (NORVASC) 5 MG tablet   Other Relevant Orders   CBC with Differential/Platelet   CMP14+EGFR   Lipid panel   Bayer DCA Hb A1c Waived   Morbid obesity ([place])   Prediabetes   Relevant Orders   Bayer DCA Hb A1c Waived    Other Visit Diagnoses    Encounter for hepatitis C screening test for low risk patient       Relevant Orders   Hepatitis C antibody      Blood pressure looks good, continue current medication, his weights up to we discussed weight loss options and exercise and options including swimming which would be a great option for him, we had tried weight loss medicines before but it did not go well for him, he wants to keep trying on his own. We will check blood work today. Follow up plan: Return in about 6 months (around 03/27/2020), or if symptoms worsen or fail to improve, for Prediabetes hypertension and cholesterol.  Counseling provided for all of the vaccine  components Orders Placed This Encounter  Procedures  . CBC with Differential/Platelet  . CMP14+EGFR  . Lipid panel  . Bayer DCA Hb A1c Waived  . Hepatitis C antibody    Caryl Pina, MD [facility] 09/25/2019, 9:17 AM

## 2019-09-26 LAB — CMP14+EGFR
ALT: 57 IU/L — ABNORMAL HIGH (ref 0–44)
AST: 53 IU/L — ABNORMAL HIGH (ref 0–40)
Albumin/Globulin Ratio: 1.4 (ref 1.2–2.2)
Albumin: 4.2 g/dL (ref 4.0–5.0)
Alkaline Phosphatase: 89 IU/L (ref 48–121)
BUN/Creatinine Ratio: 21 — ABNORMAL HIGH (ref 9–20)
BUN: 24 mg/dL (ref 6–24)
Bilirubin Total: 0.2 mg/dL (ref 0.0–1.2)
CO2: 21 mmol/L (ref 20–29)
Calcium: 9.1 mg/dL (ref 8.7–10.2)
Chloride: 104 mmol/L (ref 96–106)
Creatinine, Ser: 1.14 mg/dL (ref 0.76–1.27)
GFR calc Af Amer: 87 mL/min/{1.73_m2} (ref >59)
GFR calc non Af Amer: 76 mL/min/{1.73_m2} (ref >59)
Globulin, Total: 2.9 g/dL (ref 1.5–4.5)
Glucose: 130 mg/dL — ABNORMAL HIGH (ref 65–99)
Potassium: 4.3 mmol/L (ref 3.5–5.2)
Sodium: 142 mmol/L (ref 134–144)
Total Protein: 7.1 g/dL (ref 6.0–8.5)

## 2019-09-26 LAB — CBC WITH DIFFERENTIAL/PLATELET
Basophils Absolute: 0 10*3/uL (ref 0.0–0.2)
Basos: 1 %
EOS (ABSOLUTE): 0.1 10*3/uL (ref 0.0–0.4)
Eos: 2 %
Hematocrit: 39.5 % (ref 37.5–51.0)
Hemoglobin: 13.6 g/dL (ref 13.0–17.7)
Immature Grans (Abs): 0 10*3/uL (ref 0.0–0.1)
Immature Granulocytes: 0 %
Lymphocytes Absolute: 1.5 10*3/uL (ref 0.7–3.1)
Lymphs: 22 %
MCH: 29.9 pg (ref 26.6–33.0)
MCHC: 34.4 g/dL (ref 31.5–35.7)
MCV: 87 fL (ref 79–97)
Monocytes Absolute: 0.8 10*3/uL (ref 0.1–0.9)
Monocytes: 11 %
Neutrophils Absolute: 4.4 10*3/uL (ref 1.4–7.0)
Neutrophils: 64 %
Platelets: 264 10*3/uL (ref 150–450)
RBC: 4.55 x10E6/uL (ref 4.14–5.80)
RDW: 13 % (ref 11.6–15.4)
WBC: 6.8 10*3/uL (ref 3.4–10.8)

## 2019-09-26 LAB — LIPID PANEL
Chol/HDL Ratio: 3.3 ratio (ref 0.0–5.0)
Cholesterol, Total: 130 mg/dL (ref 100–199)
HDL: 39 mg/dL — ABNORMAL LOW (ref >39)
LDL Chol Calc (NIH): 72 mg/dL (ref 0–99)
Triglycerides: 103 mg/dL (ref 0–149)
VLDL Cholesterol Cal: 19 mg/dL (ref 5–40)

## 2019-09-26 LAB — HEPATITIS C ANTIBODY: Hep C Virus Ab: <0.1 s/co ratio (ref 0.0–0.9)

## 2020-01-26 ENCOUNTER — Other Ambulatory Visit: Payer: Self-pay | Admitting: Family Medicine

## 2020-01-26 DIAGNOSIS — I10 Essential (primary) hypertension: Secondary | ICD-10-CM

## 2020-03-30 ENCOUNTER — Ambulatory Visit: Payer: BC Managed Care – PPO | Admitting: Family Medicine

## 2020-04-17 ENCOUNTER — Ambulatory Visit: Payer: BC Managed Care – PPO | Admitting: Family Medicine

## 2020-04-17 ENCOUNTER — Encounter: Payer: Self-pay | Admitting: Family Medicine

## 2020-04-17 ENCOUNTER — Other Ambulatory Visit: Payer: Self-pay

## 2020-04-17 VITALS — BP 128/76 | HR 96 | Ht 71.0 in | Wt >= 6400 oz

## 2020-04-17 DIAGNOSIS — I10 Essential (primary) hypertension: Secondary | ICD-10-CM

## 2020-04-17 DIAGNOSIS — M170 Bilateral primary osteoarthritis of knee: Secondary | ICD-10-CM

## 2020-04-17 DIAGNOSIS — R7303 Prediabetes: Secondary | ICD-10-CM | POA: Diagnosis not present

## 2020-04-17 DIAGNOSIS — E785 Hyperlipidemia, unspecified: Secondary | ICD-10-CM | POA: Diagnosis not present

## 2020-04-17 LAB — BAYER DCA HB A1C WAIVED: HB A1C (BAYER DCA - WAIVED): 6.4 % (ref <7.0)

## 2020-04-17 MED ORDER — MELOXICAM 15 MG PO TABS: 15.0000 mg | ORAL_TABLET | Freq: Every day | ORAL | 3 refills | Status: DC

## 2020-04-17 MED ORDER — PRAVASTATIN SODIUM 40 MG PO TABS: 40.0000 mg | ORAL_TABLET | Freq: Every day | ORAL | 3 refills | Status: DC

## 2020-04-17 MED ORDER — LOSARTAN POTASSIUM 25 MG PO TABS: 25.0000 mg | ORAL_TABLET | Freq: Every day | ORAL | 3 refills | Status: DC

## 2020-04-17 MED ORDER — HYDROCHLOROTHIAZIDE 25 MG PO TABS: 25.0000 mg | ORAL_TABLET | Freq: Every day | ORAL | 3 refills | Status: DC

## 2020-04-17 NOTE — Progress Notes (Signed)
BP 128/76    Pulse 96    Ht 5' 11"  (1.803 m)    Wt (!) 437 lb (198.2 kg)    SpO2 95%    BMI 60.95 kg/m    Subjective:   Patient ID: Nathaniel Martin, male    DOB: [birth year], 50 y.o.   MRN: [id number]  HPI: Nathaniel Martin is a 50 y.o. male presenting on 04/17/2020 for Medical Management of Chronic Issues, Hypertension, and Hyperlipidemia   HPI Prediabetes Patient comes in today for recheck of his diabetes. Patient has been currently taking no medication and A1c is 6.4 which is up slightly. Patient is currently on an ACE inhibitor/ARB. Patient has not seen an ophthalmologist this year. Patient denies any issues with their feet. The symptom started onset as an adult hyperlipidemia and hypertension ARE RELATED TO DM   Hyperlipidemia Patient is coming in for recheck of his hyperlipidemia. The patient is currently taking pravastatin. They deny any issues with myalgias or history of liver damage from it. They deny any focal numbness or weakness or chest pain.   Hypertension Patient is currently on amlodipine and hydrochlorothiazide and losartan, and their blood pressure today is 128/76. Patient denies any lightheadedness or dizziness. Patient denies headaches, blurred vision, chest pains, shortness of breath, or weakness. Denies any side effects from medication and is content with current medication.   Bilateral knee osteoarthritis, chronic Is gained weight recently, discussed weight loss and importance of that specialist for his knees.  Uses meloxicam.  Relevant past medical, surgical, family and social history reviewed and updated as indicated. Interim medical history since our last visit reviewed. Allergies and medications reviewed and updated.  Review of Systems  Constitutional: Negative for chills and fever.  Respiratory: Negative for shortness of breath and wheezing.   Cardiovascular: Negative for chest pain and leg swelling.  Musculoskeletal: Positive for arthralgias. Negative  for back pain and gait problem.  Skin: Negative for rash.  All other systems reviewed and are negative.   Per HPI unless specifically indicated above   Allergies as of 04/17/2020   No Known Allergies     Medication List       Accurate as of April 17, 2020  3:13 PM. If you have any questions, ask your nurse or doctor.        acetaminophen 500 MG tablet Commonly known as: TYLENOL Take 1,000 mg by mouth every 6 (six) hours as needed.   amLODipine 5 MG tablet Commonly known as: NORVASC Take 1 tablet (5 mg total) by mouth daily.   hydrochlorothiazide 25 MG tablet Commonly known as: HYDRODIURIL Take 1 tablet (25 mg total) by mouth daily.   ibuprofen 400 MG tablet Commonly known as: ADVIL Take 400 mg by mouth every 6 (six) hours as needed.   losartan 25 MG tablet Commonly known as: COZAAR Take 1 tablet (25 mg total) by mouth daily.   meloxicam 15 MG tablet Commonly known as: MOBIC Take 1 tablet (15 mg total) by mouth daily.   pravastatin 40 MG tablet Commonly known as: PRAVACHOL Take 1 tablet (40 mg total) by mouth daily.        Objective:   BP 128/76    Pulse 96    Ht 5' 11"  (1.803 m)    Wt (!) 437 lb (198.2 kg)    SpO2 95%    BMI 60.95 kg/m   Wt Readings from Last 3 Encounters:  04/17/20 (!) 437 lb (198.2 kg)  09/25/19 (!) 425 lb  2 oz (192.8 kg)  03/18/19 (!) 421 lb 9.6 oz (191.2 kg)    Physical Exam Vitals and nursing note reviewed.  Constitutional:      General: He is not in acute distress.    Appearance: He is well-developed and well-nourished. He is not diaphoretic.  Eyes:     General: No scleral icterus.    Extraocular Movements: EOM normal.     Conjunctiva/sclera: Conjunctivae normal.  Neck:     Thyroid: No thyromegaly.  Cardiovascular:     Rate and Rhythm: Normal rate and regular rhythm.     Pulses: Intact distal pulses.     Heart sounds: Normal heart sounds. No murmur heard.   Pulmonary:     Effort: Pulmonary effort is normal. No  respiratory distress.     Breath sounds: Normal breath sounds. No wheezing.  Musculoskeletal:        General: No edema. Normal range of motion.     Cervical back: Neck supple.  Lymphadenopathy:     Cervical: No cervical adenopathy.  Skin:    General: Skin is warm and dry.     Findings: No rash.  Neurological:     Mental Status: He is alert and oriented to person, place, and time.     Coordination: Coordination normal.  Psychiatric:        Mood and Affect: Mood and affect normal.        Behavior: Behavior normal.       Assessment & Plan:   Problem List Items Addressed This Visit      Cardiovascular and Mediastinum   Hypertension   Relevant Medications   hydrochlorothiazide (HYDRODIURIL) 25 MG tablet   losartan (COZAAR) 25 MG tablet   pravastatin (PRAVACHOL) 40 MG tablet   Other Relevant Orders   CMP14+EGFR     Other   Hyperlipidemia LDL goal <130   Relevant Medications   hydrochlorothiazide (HYDRODIURIL) 25 MG tablet   losartan (COZAAR) 25 MG tablet   pravastatin (PRAVACHOL) 40 MG tablet   Other Relevant Orders   Lipid panel   Prediabetes - Primary   Relevant Orders   Bayer DCA Hb A1c Waived   CBC with Differential/Platelet    Other Visit Diagnoses    Essential hypertension       Relevant Medications   hydrochlorothiazide (HYDRODIURIL) 25 MG tablet   losartan (COZAAR) 25 MG tablet   pravastatin (PRAVACHOL) 40 MG tablet   Other Relevant Orders   CMP14+EGFR   Bilateral primary osteoarthritis of knee       Relevant Medications   meloxicam (MOBIC) 15 MG tablet      Patient needs refill meloxicam for his knees. Follow up plan: Return in about 6 months (around 10/15/2020), or if symptoms worsen or fail to improve, for Prediabetes and hypertension.  Counseling provided for all of the vaccine components Orders Placed This Encounter  Procedures   Bayer [place] Hb A1c Waived   CBC with Differential/Platelet   CMP14+EGFR   Lipid panel    Caryl Pina,  MD [facility] 04/17/2020, 3:13 PM

## 2020-04-18 LAB — CMP14+EGFR
ALT: 54 IU/L — ABNORMAL HIGH (ref 0–44)
AST: 58 IU/L — ABNORMAL HIGH (ref 0–40)
Albumin/Globulin Ratio: 1.3 (ref 1.2–2.2)
Albumin: 4.3 g/dL (ref 4.0–5.0)
Alkaline Phosphatase: 96 IU/L (ref 44–121)
BUN/Creatinine Ratio: 17 (ref 9–20)
BUN: 18 mg/dL (ref 6–24)
Bilirubin Total: 0.2 mg/dL (ref 0.0–1.2)
CO2: 22 mmol/L (ref 20–29)
Calcium: 9.8 mg/dL (ref 8.7–10.2)
Chloride: 103 mmol/L (ref 96–106)
Creatinine, Ser: 1.03 mg/dL (ref 0.76–1.27)
GFR calc Af Amer: 99 mL/min/{1.73_m2} (ref >59)
GFR calc non Af Amer: 85 mL/min/{1.73_m2} (ref >59)
Globulin, Total: 3.2 g/dL (ref 1.5–4.5)
Glucose: 140 mg/dL — ABNORMAL HIGH (ref 65–99)
Potassium: 4.6 mmol/L (ref 3.5–5.2)
Sodium: 142 mmol/L (ref 134–144)
Total Protein: 7.5 g/dL (ref 6.0–8.5)

## 2020-04-18 LAB — LIPID PANEL
Chol/HDL Ratio: 3.4 ratio (ref 0.0–5.0)
Cholesterol, Total: 132 mg/dL (ref 100–199)
HDL: 39 mg/dL — ABNORMAL LOW (ref >39)
LDL Chol Calc (NIH): 72 mg/dL (ref 0–99)
Triglycerides: 118 mg/dL (ref 0–149)
VLDL Cholesterol Cal: 21 mg/dL (ref 5–40)

## 2020-04-18 LAB — CBC WITH DIFFERENTIAL/PLATELET
Basophils Absolute: 0 10*3/uL (ref 0.0–0.2)
Basos: 1 %
EOS (ABSOLUTE): 0.2 10*3/uL (ref 0.0–0.4)
Eos: 2 %
Hematocrit: 41.3 % (ref 37.5–51.0)
Hemoglobin: 13.7 g/dL (ref 13.0–17.7)
Immature Grans (Abs): 0 10*3/uL (ref 0.0–0.1)
Immature Granulocytes: 0 %
Lymphocytes Absolute: 2.3 10*3/uL (ref 0.7–3.1)
Lymphs: 29 %
MCH: 29.7 pg (ref 26.6–33.0)
MCHC: 33.2 g/dL (ref 31.5–35.7)
MCV: 90 fL (ref 79–97)
Monocytes Absolute: 0.7 10*3/uL (ref 0.1–0.9)
Monocytes: 9 %
Neutrophils Absolute: 4.6 10*3/uL (ref 1.4–7.0)
Neutrophils: 59 %
Platelets: 263 10*3/uL (ref 150–450)
RBC: 4.61 x10E6/uL (ref 4.14–5.80)
RDW: 13.1 % (ref 11.6–15.4)
WBC: 7.8 10*3/uL (ref 3.4–10.8)

## 2020-07-14 ENCOUNTER — Other Ambulatory Visit: Payer: Self-pay | Admitting: Family Medicine

## 2020-07-14 DIAGNOSIS — I10 Essential (primary) hypertension: Secondary | ICD-10-CM

## 2020-08-20 ENCOUNTER — Encounter: Payer: Self-pay | Admitting: Family Medicine

## 2020-08-20 ENCOUNTER — Ambulatory Visit: Payer: BC Managed Care – PPO | Admitting: Family Medicine

## 2020-08-20 ENCOUNTER — Other Ambulatory Visit: Payer: Self-pay

## 2020-08-20 VITALS — BP 137/87 | HR 96 | Temp 97.9°F | Ht 71.0 in | Wt >= 6400 oz

## 2020-08-20 DIAGNOSIS — M545 Low back pain, unspecified: Secondary | ICD-10-CM

## 2020-08-20 MED ORDER — KETOROLAC TROMETHAMINE 60 MG/2ML IM SOLN
60.0000 mg | Freq: Once | INTRAMUSCULAR | Status: AC
  Administered 2020-08-20: 60 mg via INTRAMUSCULAR

## 2020-08-20 MED ORDER — METHOCARBAMOL 500 MG PO TABS: 500.0000 mg | ORAL_TABLET | Freq: Three times a day (TID) | ORAL | 0 refills | Status: DC | PRN

## 2020-08-20 MED ORDER — PREDNISONE 10 MG (21) PO TBPK: ORAL_TABLET | ORAL | 0 refills | Status: DC

## 2020-08-20 NOTE — Progress Notes (Signed)
Assessment & Plan:  1. Acute right-sided low back pain without sciatica Heating pad Muscle rub Lidoderm patches Exercises provided on AVS - methocarbamol (ROBAXIN) 500 MG tablet; Take 1 tablet (500 mg total) by mouth every 8 (eight) hours as needed for muscle spasms.  Dispense: 30 tablet; Refill: 0 - ketorolac (TORADOL) injection 60 mg - given in office - predniSONE (STERAPRED UNI-PAK 21 TAB) 10 MG (21) TBPK tablet; As directed x 6 days  Dispense: 21 tablet; Refill: 0   Follow up plan: Return if symptoms worsen or fail to improve.  Deliah Boston, MSN, APRN, FNP-C [facility]  Subjective:   Patient ID: Nathaniel Martin, male    DOB: 06/09/1970, 50 y.o.   MRN: [id number]  HPI: Nathaniel Martin is a 50 y.o. male presenting on 08/20/2020 for Back Pain (X 4 days)  Back Pain: Patient presents for presents evaluation of low back problems.  Symptoms have been present for 4 days and include pain in the right lower back (dull and sharp in character; 8/10 in severity).  He is having nausea with the pain. Initial inciting event: none.  He does work in the 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  Alleviating factors identifiable by patient are  lying on the stomach . Exacerbating factors identifiable by patient are sitting. Treatments so far initiated by patient:  Tylenol which was not helpful  Previous lower back problems: none. Previous workup: none.    ROS: Negative unless specifically indicated above in HPI.   Relevant past medical history reviewed and updated as indicated.   Allergies and medications reviewed and updated.   Current Outpatient Medications:    acetaminophen (TYLENOL) 500 MG tablet, Take 1,000 mg by mouth every 6 (six) hours as needed., Disp: , Rfl:    amLODipine (NORVASC) 5 MG tablet, TAKE 1 TABLET BY MOUTH EVERY DAY, Disp: 90 tablet, Rfl: 0   hydrochlorothiazide (HYDRODIURIL) 25 MG tablet, Take 1 tablet (25 mg total)  by mouth daily., Disp: 90 tablet, Rfl: 3   ibuprofen (ADVIL,MOTRIN) 400 MG tablet, Take 400 mg by mouth every 6 (six) hours as needed., Disp: , Rfl:    losartan (COZAAR) 25 MG tablet, Take 1 tablet (25 mg total) by mouth daily., Disp: 90 tablet, Rfl: 3   meloxicam (MOBIC) 15 MG tablet, Take 1 tablet (15 mg total) by mouth daily., Disp: 90 tablet, Rfl: 3   pravastatin (PRAVACHOL) 40 MG tablet, Take 1 tablet (40 mg total) by mouth daily., Disp: 90 tablet, Rfl: 3  No Known Allergies  Objective:   BP 137/87   Pulse 96   Temp 97.9 F (36.6 C) (Temporal)   Ht 5\' 11"  (1.803 m)   Wt (!) 429 lb 3.2 oz (194.7 kg)   SpO2 95%   BMI 59.86 kg/m    Physical Exam Vitals reviewed.  Constitutional:      General: He is not in acute distress.    Appearance: Normal appearance. He is morbidly obese. He is not ill-appearing, toxic-appearing or diaphoretic.  HENT:     Head: Normocephalic and atraumatic.  Eyes:     General: No scleral icterus.       Right eye: No discharge.        Left eye: No discharge.     Conjunctiva/sclera: Conjunctivae normal.  Cardiovascular:     Rate and Rhythm: Normal rate.  Pulmonary:     Effort: Pulmonary effort is normal. No respiratory distress.  Musculoskeletal:  General: Normal range of motion.     Cervical back: Normal range of motion.     Lumbar back: Tenderness (right side) present. No swelling, edema, deformity, signs of trauma, lacerations, spasms or bony tenderness. Normal range of motion. Negative right straight leg raise test and negative left straight leg raise test.  Skin:    General: Skin is warm and dry.  Neurological:     Mental Status: He is alert and oriented to person, place, and time. Mental status is at baseline.  Psychiatric:        Mood and Affect: Mood normal.        Behavior: Behavior normal.        Thought Content: Thought content normal.        Judgment: Judgment normal.

## 2020-08-20 NOTE — Patient Instructions (Signed)
Heating pad Muscle rub Lidoderm patches Exercises

## 2020-08-23 ENCOUNTER — Encounter: Payer: Self-pay | Admitting: Family Medicine

## 2020-10-11 ENCOUNTER — Other Ambulatory Visit: Payer: Self-pay | Admitting: Family Medicine

## 2020-10-11 DIAGNOSIS — I10 Essential (primary) hypertension: Secondary | ICD-10-CM

## 2020-10-16 ENCOUNTER — Ambulatory Visit: Payer: BC Managed Care – PPO | Admitting: Family Medicine

## 2020-11-19 ENCOUNTER — Encounter: Payer: Self-pay | Admitting: Family Medicine

## 2020-11-19 ENCOUNTER — Ambulatory Visit: Payer: BC Managed Care – PPO | Admitting: Family Medicine

## 2020-11-19 ENCOUNTER — Other Ambulatory Visit: Payer: Self-pay

## 2020-11-19 VITALS — BP 141/85 | HR 110 | Ht 71.0 in | Wt >= 6400 oz

## 2020-11-19 DIAGNOSIS — E785 Hyperlipidemia, unspecified: Secondary | ICD-10-CM | POA: Diagnosis not present

## 2020-11-19 DIAGNOSIS — Z23 Encounter for immunization: Secondary | ICD-10-CM

## 2020-11-19 DIAGNOSIS — I10 Essential (primary) hypertension: Secondary | ICD-10-CM

## 2020-11-19 DIAGNOSIS — R7303 Prediabetes: Secondary | ICD-10-CM

## 2020-11-19 LAB — BAYER DCA HB A1C WAIVED: HB A1C (BAYER DCA - WAIVED): 6.3 % — ABNORMAL HIGH (ref 4.8–5.6)

## 2020-11-19 MED ORDER — AMLODIPINE BESYLATE 5 MG PO TABS: 5.0000 mg | ORAL_TABLET | Freq: Every day | ORAL | 3 refills | Status: DC

## 2020-11-19 NOTE — Progress Notes (Signed)
BP (!) 141/85   Pulse (!) 110   Ht 5' 11"  (1.803 m)   Wt (!) 433 lb (196.4 kg)   SpO2 94%   BMI 60.39 kg/m    Subjective:   Patient ID: Nathaniel Martin, male    DOB: 1970/08/15, 50 y.o.   MRN: [id number]  HPI: Nathaniel Martin is a 50 y.o. male presenting on 11/19/2020 for Medical Management of Chronic Issues, Hyperlipidemia, Hypertension, Prediabetes, and Obesity   HPI Prediabetes  patient comes in today for recheck of his diabetes. Patient has been currently taking No medication currently has been diet controlled.  A1c is pending. Patient is currently on an ACE inhibitor/ARB. Patient has not seen an ophthalmologist this year. Patient Denies any issues with their feet. The symptom started onset as an adult Hypertension and hyperlipidemia ARE RELATED TO DM   Hypertension Patient is currently on Hydrochlorothiazide and losartan, and their blood pressure today is 141/85. Patient denies any lightheadedness or dizziness. Patient denies headaches, blurred vision, chest pains, shortness of breath, or weakness. Denies any side effects from medication and is content with current medication.   Hyperlipidemia Patient is coming in for recheck of his hyperlipidemia. The patient is currently taking Pravastatin. They deny any issues with myalgias or history of liver damage from it. They deny any focal numbness or weakness or chest pain.   Patient's Prediabetes and hypertension and cholesterol are more complicated by the patient's morbid obesity.  Discussed weight loss and lifestyle modification and exercise with the patient.   Relevant past medical, surgical, family and social history reviewed and updated as indicated. Interim medical history since our last visit reviewed. Allergies and medications reviewed and updated.  Review of Systems  Constitutional:  Negative for chills and fever.  Eyes:  Negative for visual disturbance.  Respiratory:  Negative for shortness of breath and wheezing.    Cardiovascular:  Negative for chest pain and leg swelling.  Musculoskeletal:  Negative for back pain and gait problem.  Skin:  Negative for rash.  All other systems reviewed and are negative.  Per HPI unless specifically indicated above   Allergies as of 11/19/2020   No Known Allergies      Medication List        Accurate as of November 19, 2020  8:59 AM. If you have any questions, ask your nurse or doctor.          STOP taking these medications    methocarbamol 500 MG tablet Commonly known as: Robaxin Stopped by: Fransisca Kaufmann Alydia Gosser, MD   predniSONE 10 MG (21) Tbpk tablet Commonly known as: STERAPRED UNI-PAK 21 TAB Stopped by: Fransisca Kaufmann Madelena Maturin, MD       TAKE these medications    acetaminophen 500 MG tablet Commonly known as: TYLENOL Take 1,000 mg by mouth every 6 (six) hours as needed.   amLODipine 5 MG tablet Commonly known as: NORVASC Take 1 tablet (5 mg total) by mouth daily.   hydrochlorothiazide 25 MG tablet Commonly known as: HYDRODIURIL Take 1 tablet (25 mg total) by mouth daily.   losartan 25 MG tablet Commonly known as: COZAAR Take 1 tablet (25 mg total) by mouth daily.   meloxicam 15 MG tablet Commonly known as: MOBIC Take 1 tablet (15 mg total) by mouth daily.   pravastatin 40 MG tablet Commonly known as: PRAVACHOL Take 1 tablet (40 mg total) by mouth daily.         Objective:   BP (!) 141/85  Pulse (!) 110   Ht 5' 11"  (1.803 m)   Wt (!) 433 lb (196.4 kg)   SpO2 94%   BMI 60.39 kg/m   Wt Readings from Last 3 Encounters:  11/19/20 (!) 433 lb (196.4 kg)  08/20/20 (!) 429 lb 3.2 oz (194.7 kg)  04/17/20 (!) 437 lb (198.2 kg)    Physical Exam Vitals and nursing note reviewed.  Constitutional:      General: He is not in acute distress.    Appearance: He is well-developed. He is not diaphoretic.  Eyes:     General: No scleral icterus.    Conjunctiva/sclera: Conjunctivae normal.  Neck:     Thyroid: No thyromegaly.   Cardiovascular:     Rate and Rhythm: Normal rate and regular rhythm.     Heart sounds: Normal heart sounds. No murmur heard. Pulmonary:     Effort: Pulmonary effort is normal. No respiratory distress.     Breath sounds: Normal breath sounds. No wheezing.  Musculoskeletal:        General: Normal range of motion.     Cervical back: Neck supple.  Lymphadenopathy:     Cervical: No cervical adenopathy.  Skin:    General: Skin is warm and dry.     Findings: No rash.  Neurological:     Mental Status: He is alert and oriented to person, place, and time.     Coordination: Coordination normal.  Psychiatric:        Behavior: Behavior normal.      Assessment & Plan:   Problem List Items Addressed This Visit       Cardiovascular and Mediastinum   Hypertension - Primary   Relevant Medications   amLODipine (NORVASC) 5 MG tablet   Other Relevant Orders   CBC with Differential/Platelet   CMP14+EGFR     Other   Hyperlipidemia LDL goal <130   Relevant Medications   amLODipine (NORVASC) 5 MG tablet   Other Relevant Orders   Lipid panel   Prediabetes   Relevant Orders   Bayer DCA Hb A1c Waived   Morbid obesity ([place])   Other Visit Diagnoses     Essential hypertension       Relevant Medications   amLODipine (NORVASC) 5 MG tablet   Need for immunization against influenza       Relevant Orders   Flu Vaccine QUAD 18moIM (Fluarix, Fluzone & Alfiuria Quad PF) (Completed)     Continue current medication, we will see where her blood work is, for really focus on lifestyle modification and diet.  Follow up plan: Return in about 3 months (around 02/18/2021), or if symptoms worsen or fail to improve, for Prediabetes hypertension and cholesterol.  Counseling provided for all of the vaccine components Orders Placed This Encounter  Procedures   Flu Vaccine QUAD 65moM (Fluarix, Fluzone & Alfiuria Quad PF)   CBC with Differential/Platelet   CMP14+EGFR   Lipid panel   Bayer DCA Hb A1c  Waived    JoCaryl PinaMD [facility] 11/19/2020, 8:59 AM

## 2020-11-20 LAB — CBC WITH DIFFERENTIAL/PLATELET
Basophils Absolute: 0 10*3/uL (ref 0.0–0.2)
Basos: 1 %
EOS (ABSOLUTE): 0.1 10*3/uL (ref 0.0–0.4)
Eos: 1 %
Hematocrit: 42.3 % (ref 37.5–51.0)
Hemoglobin: 13.8 g/dL (ref 13.0–17.7)
Immature Grans (Abs): 0 10*3/uL (ref 0.0–0.1)
Immature Granulocytes: 0 %
Lymphocytes Absolute: 1.3 10*3/uL (ref 0.7–3.1)
Lymphs: 21 %
MCH: 29.7 pg (ref 26.6–33.0)
MCHC: 32.6 g/dL (ref 31.5–35.7)
MCV: 91 fL (ref 79–97)
Monocytes Absolute: 0.5 10*3/uL (ref 0.1–0.9)
Monocytes: 9 %
Neutrophils Absolute: 4.3 10*3/uL (ref 1.4–7.0)
Neutrophils: 68 %
Platelets: 247 10*3/uL (ref 150–450)
RBC: 4.64 x10E6/uL (ref 4.14–5.80)
RDW: 12.9 % (ref 11.6–15.4)
WBC: 6.2 10*3/uL (ref 3.4–10.8)

## 2020-11-20 LAB — CMP14+EGFR
ALT: 58 IU/L — ABNORMAL HIGH (ref 0–44)
AST: 54 IU/L — ABNORMAL HIGH (ref 0–40)
Albumin/Globulin Ratio: 1.5 (ref 1.2–2.2)
Albumin: 4.5 g/dL (ref 4.0–5.0)
Alkaline Phosphatase: 106 IU/L (ref 44–121)
BUN/Creatinine Ratio: 15 (ref 9–20)
BUN: 15 mg/dL (ref 6–24)
Bilirubin Total: 0.3 mg/dL (ref 0.0–1.2)
CO2: 24 mmol/L (ref 20–29)
Calcium: 9.9 mg/dL (ref 8.7–10.2)
Chloride: 102 mmol/L (ref 96–106)
Creatinine, Ser: 1.03 mg/dL (ref 0.76–1.27)
Globulin, Total: 3 g/dL (ref 1.5–4.5)
Glucose: 157 mg/dL — ABNORMAL HIGH (ref 65–99)
Potassium: 4.7 mmol/L (ref 3.5–5.2)
Sodium: 141 mmol/L (ref 134–144)
Total Protein: 7.5 g/dL (ref 6.0–8.5)
eGFR: 89 mL/min/{1.73_m2} (ref >59)

## 2020-11-20 LAB — LIPID PANEL
Chol/HDL Ratio: 3.3 ratio (ref 0.0–5.0)
Cholesterol, Total: 142 mg/dL (ref 100–199)
HDL: 43 mg/dL (ref >39)
LDL Chol Calc (NIH): 81 mg/dL (ref 0–99)
Triglycerides: 93 mg/dL (ref 0–149)
VLDL Cholesterol Cal: 18 mg/dL (ref 5–40)

## 2021-02-19 ENCOUNTER — Ambulatory Visit: Payer: BC Managed Care – PPO | Admitting: Family Medicine

## 2021-03-11 ENCOUNTER — Other Ambulatory Visit: Payer: Self-pay | Admitting: Family Medicine

## 2021-03-11 DIAGNOSIS — I10 Essential (primary) hypertension: Secondary | ICD-10-CM

## 2021-03-29 ENCOUNTER — Encounter: Payer: Self-pay | Admitting: Family Medicine

## 2021-03-29 ENCOUNTER — Ambulatory Visit: Payer: BC Managed Care – PPO | Admitting: Family Medicine

## 2021-03-29 ENCOUNTER — Other Ambulatory Visit: Payer: Self-pay

## 2021-03-29 VITALS — BP 117/71 | HR 90 | Ht 71.0 in | Wt >= 6400 oz

## 2021-03-29 DIAGNOSIS — I10 Essential (primary) hypertension: Secondary | ICD-10-CM | POA: Diagnosis not present

## 2021-03-29 DIAGNOSIS — R7303 Prediabetes: Secondary | ICD-10-CM

## 2021-03-29 DIAGNOSIS — E785 Hyperlipidemia, unspecified: Secondary | ICD-10-CM

## 2021-03-29 DIAGNOSIS — M170 Bilateral primary osteoarthritis of knee: Secondary | ICD-10-CM | POA: Diagnosis not present

## 2021-03-29 LAB — BAYER DCA HB A1C WAIVED: HB A1C (BAYER DCA - WAIVED): 4.6 % — ABNORMAL LOW (ref 4.8–5.6)

## 2021-03-29 MED ORDER — AMLODIPINE BESYLATE 5 MG PO TABS: 5.0000 mg | ORAL_TABLET | Freq: Every day | ORAL | 3 refills | Status: DC

## 2021-03-29 MED ORDER — HYDROCHLOROTHIAZIDE 25 MG PO TABS: 25.0000 mg | ORAL_TABLET | Freq: Every day | ORAL | 3 refills | Status: AC

## 2021-03-29 MED ORDER — PRAVASTATIN SODIUM 40 MG PO TABS: 40.0000 mg | ORAL_TABLET | Freq: Every day | ORAL | 3 refills | Status: AC

## 2021-03-29 MED ORDER — MELOXICAM 15 MG PO TABS: 15.0000 mg | ORAL_TABLET | Freq: Every day | ORAL | 3 refills | Status: AC

## 2021-03-29 MED ORDER — LOSARTAN POTASSIUM 25 MG PO TABS: 25.0000 mg | ORAL_TABLET | Freq: Every day | ORAL | 3 refills | Status: AC

## 2021-03-29 NOTE — Progress Notes (Signed)
BP 117/71    Pulse 90    Ht 5' 11"  (1.803 m)    Wt (!) 430 lb (195 kg)    SpO2 95%    BMI 59.97 kg/m    Subjective:   Patient ID: Nathaniel Martin, male    DOB: 1970/05/18, 51 y.o.   MRN: [id number]  HPI: Nathaniel Martin is a 51 y.o. male presenting on 03/29/2021 for Medical Management of Chronic Issues, Hyperlipidemia, Hypertension, and Prediabetes   HPI Prediabetes Patient comes in today for recheck of his diabetes. Patient has been currently taking no medications. Patient is currently on an ACE inhibitor/ARB. Patient has not seen an ophthalmologist this year. Patient denies any issues with their feet. The symptom started onset as an adult hld and htn ARE RELATED TO DM   Hyperlipidemia Patient is coming in for recheck of his hyperlipidemia. The patient is currently taking pravastatin. They deny any issues with myalgias or history of liver damage from it. They deny any focal numbness or weakness or chest pain.   Hypertension Patient is currently on amlodipine and hydrochlorothiazide olmesartan, and their blood pressure today is 117/71. Patient denies any lightheadedness or dizziness. Patient denies headaches, blurred vision, chest pains, shortness of breath, or weakness. Denies any side effects from medication and is content with current medication.   Relevant past medical, surgical, family and social history reviewed and updated as indicated. Interim medical history since our last visit reviewed. Allergies and medications reviewed and updated.  Review of Systems  Constitutional:  Negative for chills and fever.  Eyes:  Negative for visual disturbance.  Respiratory:  Negative for shortness of breath and wheezing.   Cardiovascular:  Negative for chest pain and leg swelling.  Musculoskeletal:  Negative for back pain and gait problem.  Skin:  Negative for rash.  Neurological:  Negative for dizziness, weakness and light-headedness.  All other systems reviewed and are  negative.  Per HPI unless specifically indicated above   Allergies as of 03/29/2021   No Known Allergies      Medication List        Accurate as of March 29, 2021  1:58 PM. If you have any questions, ask your nurse or doctor.          acetaminophen 500 MG tablet Commonly known as: TYLENOL Take 1,000 mg by mouth every 6 (six) hours as needed.   amLODipine 5 MG tablet Commonly known as: NORVASC Take 1 tablet (5 mg total) by mouth daily.   hydrochlorothiazide 25 MG tablet Commonly known as: HYDRODIURIL Take 1 tablet (25 mg total) by mouth daily.   losartan 25 MG tablet Commonly known as: COZAAR Take 1 tablet (25 mg total) by mouth daily.   meloxicam 15 MG tablet Commonly known as: MOBIC Take 1 tablet (15 mg total) by mouth daily.   pravastatin 40 MG tablet Commonly known as: PRAVACHOL Take 1 tablet (40 mg total) by mouth daily.         Objective:   BP 117/71    Pulse 90    Ht 5' 11"  (1.803 m)    Wt (!) 430 lb (195 kg)    SpO2 95%    BMI 59.97 kg/m   Wt Readings from Last 3 Encounters:  03/29/21 (!) 430 lb (195 kg)  11/19/20 (!) 433 lb (196.4 kg)  08/20/20 (!) 429 lb 3.2 oz (194.7 kg)    Physical Exam Vitals and nursing note reviewed.  Constitutional:      General: He  is not in acute distress.    Appearance: He is well-developed. He is not diaphoretic.  Eyes:     General: No scleral icterus.    Conjunctiva/sclera: Conjunctivae normal.  Cardiovascular:     Rate and Rhythm: Normal rate and regular rhythm.     Heart sounds: Normal heart sounds. No murmur heard. Pulmonary:     Effort: Pulmonary effort is normal. No respiratory distress.     Breath sounds: Normal breath sounds. No wheezing.  Musculoskeletal:        General: Normal range of motion.  Skin:    General: Skin is warm and dry.     Findings: No rash.  Neurological:     Mental Status: He is alert and oriented to person, place, and time.     Coordination: Coordination normal.   Psychiatric:        Behavior: Behavior normal.      Assessment & Plan:   Problem List Items Addressed This Visit       Cardiovascular and Mediastinum   Hypertension - Primary   Relevant Medications   pravastatin (PRAVACHOL) 40 MG tablet   losartan (COZAAR) 25 MG tablet   hydrochlorothiazide (HYDRODIURIL) 25 MG tablet   amLODipine (NORVASC) 5 MG tablet   Other Relevant Orders   CBC with Differential/Platelet   CMP14+EGFR     Other   Hyperlipidemia LDL goal <130   Relevant Medications   pravastatin (PRAVACHOL) 40 MG tablet   losartan (COZAAR) 25 MG tablet   hydrochlorothiazide (HYDRODIURIL) 25 MG tablet   amLODipine (NORVASC) 5 MG tablet   Other Relevant Orders   Lipid panel   Prediabetes   Relevant Orders   Bayer DCA Hb A1c Waived   Other Visit Diagnoses     Bilateral primary osteoarthritis of knee       Relevant Medications   meloxicam (MOBIC) 15 MG tablet   Essential hypertension       Relevant Medications   pravastatin (PRAVACHOL) 40 MG tablet   losartan (COZAAR) 25 MG tablet   hydrochlorothiazide (HYDRODIURIL) 25 MG tablet   amLODipine (NORVASC) 5 MG tablet       Continue current medicine, no changes.  Discussed diet and lifestyle modification and weight loss.  He wants to do it on his own.   Follow up plan: Return in about 3 months (around 06/27/2021), or if symptoms worsen or fail to improve, for Prediabetes hypertension.  Counseling provided for all of the vaccine components Orders Placed This Encounter  Procedures   CBC with Differential/Platelet   CMP14+EGFR   Lipid panel   Bayer DCA Hb A1c [place], MD [facility] 03/29/2021, 1:58 PM

## 2021-03-30 LAB — CMP14+EGFR
ALT: 61 IU/L — ABNORMAL HIGH (ref 0–44)
AST: 71 IU/L — ABNORMAL HIGH (ref 0–40)
Albumin/Globulin Ratio: 1.4 (ref 1.2–2.2)
Albumin: 4.2 g/dL (ref 4.0–5.0)
Alkaline Phosphatase: 125 IU/L — ABNORMAL HIGH (ref 44–121)
BUN/Creatinine Ratio: 15 (ref 9–20)
BUN: 15 mg/dL (ref 6–24)
Bilirubin Total: 0.3 mg/dL (ref 0.0–1.2)
CO2: 22 mmol/L (ref 20–29)
Calcium: 9 mg/dL (ref 8.7–10.2)
Chloride: 104 mmol/L (ref 96–106)
Creatinine, Ser: 0.99 mg/dL (ref 0.76–1.27)
Globulin, Total: 2.9 g/dL (ref 1.5–4.5)
Glucose: 232 mg/dL — ABNORMAL HIGH (ref 70–99)
Potassium: 4.2 mmol/L (ref 3.5–5.2)
Sodium: 142 mmol/L (ref 134–144)
Total Protein: 7.1 g/dL (ref 6.0–8.5)
eGFR: 93 mL/min/{1.73_m2} (ref >59)

## 2021-03-30 LAB — CBC WITH DIFFERENTIAL/PLATELET
Basophils Absolute: 0 10*3/uL (ref 0.0–0.2)
Basos: 1 %
EOS (ABSOLUTE): 0.1 10*3/uL (ref 0.0–0.4)
Eos: 2 %
Hematocrit: 41.2 % (ref 37.5–51.0)
Hemoglobin: 13.6 g/dL (ref 13.0–17.7)
Immature Grans (Abs): 0 10*3/uL (ref 0.0–0.1)
Immature Granulocytes: 0 %
Lymphocytes Absolute: 1.9 10*3/uL (ref 0.7–3.1)
Lymphs: 30 %
MCH: 30.3 pg (ref 26.6–33.0)
MCHC: 33 g/dL (ref 31.5–35.7)
MCV: 92 fL (ref 79–97)
Monocytes Absolute: 0.6 10*3/uL (ref 0.1–0.9)
Monocytes: 10 %
Neutrophils Absolute: 3.6 10*3/uL (ref 1.4–7.0)
Neutrophils: 57 %
Platelets: 243 10*3/uL (ref 150–450)
RBC: 4.49 x10E6/uL (ref 4.14–5.80)
RDW: 13 % (ref 11.6–15.4)
WBC: 6.3 10*3/uL (ref 3.4–10.8)

## 2021-03-30 LAB — LIPID PANEL
Chol/HDL Ratio: 3.4 ratio (ref 0.0–5.0)
Cholesterol, Total: 124 mg/dL (ref 100–199)
HDL: 37 mg/dL — ABNORMAL LOW (ref >39)
LDL Chol Calc (NIH): 66 mg/dL (ref 0–99)
Triglycerides: 113 mg/dL (ref 0–149)
VLDL Cholesterol Cal: 21 mg/dL (ref 5–40)

## 2021-05-17 ENCOUNTER — Ambulatory Visit: Payer: BC Managed Care – PPO | Admitting: Family Medicine

## 2021-05-17 ENCOUNTER — Ambulatory Visit (HOSPITAL_BASED_OUTPATIENT_CLINIC_OR_DEPARTMENT_OTHER)
Admission: RE | Admit: 2021-05-17 | Discharge: 2021-05-17 | Disposition: A | Discharge status: Home / Self Care | Payer: BC Managed Care – PPO | Source: Ambulatory Visit | Attending: Family Medicine | Admitting: Family Medicine

## 2021-05-17 ENCOUNTER — Other Ambulatory Visit: Payer: Self-pay

## 2021-05-17 ENCOUNTER — Encounter: Payer: Self-pay | Admitting: Family Medicine

## 2021-05-17 VITALS — BP 150/83 | HR 104 | Temp 97.4°F | Ht 71.0 in | Wt >= 6400 oz

## 2021-05-17 DIAGNOSIS — N410 Acute prostatitis: Secondary | ICD-10-CM

## 2021-05-17 DIAGNOSIS — R310 Gross hematuria: Secondary | ICD-10-CM | POA: Insufficient documentation

## 2021-05-17 DIAGNOSIS — N3090 Cystitis, unspecified without hematuria: Secondary | ICD-10-CM | POA: Diagnosis not present

## 2021-05-17 DIAGNOSIS — N200 Calculus of kidney: Secondary | ICD-10-CM | POA: Diagnosis not present

## 2021-05-17 LAB — MICROSCOPIC EXAMINATION
Bacteria, UA: NONE SEEN
Epithelial Cells (non renal): NONE SEEN /hpf (ref 0–10)
RBC, Urine: NONE SEEN /hpf (ref 0–2)
Renal Epithel, UA: NONE SEEN /hpf
WBC, UA: NONE SEEN /hpf (ref 0–5)

## 2021-05-17 LAB — URINALYSIS, COMPLETE
Bilirubin, UA: NEGATIVE
Glucose, UA: NEGATIVE
Ketones, UA: NEGATIVE
Leukocytes,UA: NEGATIVE
Nitrite, UA: NEGATIVE
Protein,UA: NEGATIVE
Specific Gravity, UA: 1.015 (ref 1.005–1.030)
Urobilinogen, Ur: 0.2 mg/dL (ref 0.2–1.0)
pH, UA: 7 (ref 5.0–7.5)

## 2021-05-17 MED ORDER — CIPROFLOXACIN HCL 500 MG PO TABS: 500.0000 mg | ORAL_TABLET | Freq: Two times a day (BID) | ORAL | 0 refills | Status: DC

## 2021-05-17 NOTE — Addendum Note (Signed)
Addended by: Mechele Claude on: 05/17/2021 03:49 PM ? ? Modules accepted: Orders ? ?

## 2021-05-17 NOTE — Progress Notes (Addendum)
? ?Subjective:  ?Patient ID: Nathaniel Martin, male    DOB: 1971-01-10  Age: 51 y.o. MRN: [id number] ? ?CC: Cystitis ? ? ?HPI ?Nathaniel Martin presents for 2 weeks of right lower back pain. Feels dull. 6/10. Helped for a while to sit in a hot bath last night. Drinking water helps a little. Denies fever, but had chills yesterday. Vomited 4-5 days ago. Nausea off and on since. Eating okay.  ? ?Frequency and dysuria onset a weeks ago. Having suprapubic pain intermittently for a week. Feels sharp. Helps when he  pees.  ? ?Depression screen [facility] 2/9 05/17/2021 03/29/2021 11/19/2020  ?Decreased Interest 0 0 0  ?Down, Depressed, Hopeless 0 0 0  ?PHQ - 2 Score 0 0 0  ?Altered sleeping - - 0  ?Tired, decreased energy - - 0  ?Change in appetite - - 0  ?Feeling bad or failure about yourself  - - 0  ?Trouble concentrating - - 0  ?Moving slowly or fidgety/restless - - 0  ?Suicidal thoughts - - 0  ?PHQ-9 Score - - -  ?Difficult doing work/chores - - -  ?Some recent data might be hidden  ? ? ?History ?Nathaniel Martin has a past medical history of Hyperlipidemia and Hypertension.  ? ?He has a past surgical history that includes Kidney stone surgery.  ? ?His family history includes Cancer in his father; Diabetes in his brother, brother, father, and mother; Heart attack in his paternal grandfather and paternal grandmother; Heart disease in his paternal grandfather and paternal grandmother.He reports that he has never smoked. He has never used smokeless tobacco. He reports that he does not drink alcohol and does not use drugs. ? ? ? ?ROS ?Review of Systems  ?Constitutional:  Negative for fever.  ?HENT:  Positive for postnasal drip (X2-3 days) and sore throat.   ?Respiratory:  Negative for cough and shortness of breath.   ?Cardiovascular:  Negative for chest pain.  ?Musculoskeletal:  Positive for back pain. Negative for arthralgias.  ?Skin:  Negative for rash.  ? ?Objective:  ?BP (!) 150/83   Pulse (!) 104   Temp (!) 97.4 ?F (36.3 ?C)    Ht 5\' 11"  (1.803 m)   Wt (!) 426 lb 9.6 oz (193.5 kg)   SpO2 97%   BMI 59.50 kg/m?  ? ?BP Readings from Last 3 Encounters:  ?05/17/21 (!) 150/83  ?03/29/21 117/71  ?11/19/20 (!) 141/85  ? ? ?Wt Readings from Last 3 Encounters:  ?05/17/21 (!) 426 lb 9.6 oz (193.5 kg)  ?03/29/21 (!) 430 lb (195 kg)  ?11/19/20 (!) 433 lb (196.4 kg)  ? ? ? ?Physical Exam ?Constitutional:   ?   General: He is not in acute distress. ?   Appearance: He is well-developed.  ?HENT:  ?   Head: Normocephalic and atraumatic.  ?   Right Ear: External ear normal.  ?   Left Ear: External ear normal.  ?   Nose: Nose normal.  ?Eyes:  ?   Conjunctiva/sclera: Conjunctivae normal.  ?   Pupils: Pupils are equal, round, and reactive to light.  ?Cardiovascular:  ?   Rate and Rhythm: Normal rate and regular rhythm.  ?   Heart sounds: Normal heart sounds. No murmur heard. ?Pulmonary:  ?   Effort: Pulmonary effort is normal. No respiratory distress.  ?   Breath sounds: Normal breath sounds. No wheezing or rales.  ?Abdominal:  ?   Palpations: Abdomen is soft.  ?   Tenderness: There is no abdominal tenderness.  ?  Musculoskeletal:     ?   General: Tenderness (right lower back L5 level) present. Normal range of motion.  ?   Cervical back: Normal range of motion and neck supple.  ?Skin: ?   General: Skin is warm and dry.  ?Neurological:  ?   Mental Status: He is alert and oriented to person, place, and time.  ?   Deep Tendon Reflexes: Reflexes are normal and symmetric.  ?Psychiatric:     ?   Behavior: Behavior normal.     ?   Thought Content: Thought content normal.     ?   Judgment: Judgment normal.  ? ? ? ? ?Assessment & Plan:  ? ?Nathaniel Martin was seen today for cystitis. ? ?Diagnoses and all orders for this visit: ? ?Gross hematuria ?-     Urinalysis, Complete ?-     Urine Culture ?-     CT RENAL STONE STUDY; Future ? ?Acute prostatitis ? ?Other orders ?-     Microscopic Examination ?-     ciprofloxacin (CIPRO) 500 MG tablet; Take 1 tablet (500 mg total) by mouth  2 (two) times daily. For prostate. Take all of these. ? ? ? ? ? ? ?I am having Nathaniel Martin start on ciprofloxacin. I am also having him maintain his acetaminophen, pravastatin, meloxicam, losartan, hydrochlorothiazide, and amLODipine. ? ?Allergies as of 05/17/2021   ?No Known Allergies ?  ? ?  ?Medication List  ?  ? ?  ? Accurate as of May 17, 2021  3:45 PM. If you have any questions, ask your nurse or doctor.  ?  ?  ? ?  ? ?acetaminophen 500 MG tablet ?Commonly known as: TYLENOL ?Take 1,000 mg by mouth every 6 (six) hours as needed. ?  ?amLODipine 5 MG tablet ?Commonly known as: NORVASC ?Take 1 tablet (5 mg total) by mouth daily. ?  ?ciprofloxacin 500 MG tablet ?Commonly known as: Cipro ?Take 1 tablet (500 mg total) by mouth 2 (two) times daily. For prostate. Take all of these. ?Started by: Claretta Fraise, MD ?  ?hydrochlorothiazide 25 MG tablet ?Commonly known as: HYDRODIURIL ?Take 1 tablet (25 mg total) by mouth daily. ?  ?losartan 25 MG tablet ?Commonly known as: COZAAR ?Take 1 tablet (25 mg total) by mouth daily. ?  ?meloxicam 15 MG tablet ?Commonly known as: MOBIC ?Take 1 tablet (15 mg total) by mouth daily. ?  ?pravastatin 40 MG tablet ?Commonly known as: PRAVACHOL ?Take 1 tablet (40 mg total) by mouth daily. ?  ? ?  ? ? ? ?Follow-up: Return if symptoms worsen or fail to improve. ? ?Claretta Fraise, M.D. ?

## 2021-05-23 LAB — URINE CULTURE

## 2021-07-05 ENCOUNTER — Ambulatory Visit: Payer: BC Managed Care – PPO | Admitting: Family Medicine

## 2021-07-05 ENCOUNTER — Encounter: Payer: Self-pay | Admitting: Family Medicine

## 2021-07-05 VITALS — BP 141/78 | HR 88 | Ht 71.0 in | Wt >= 6400 oz

## 2021-07-05 DIAGNOSIS — R7303 Prediabetes: Secondary | ICD-10-CM

## 2021-07-05 DIAGNOSIS — R7989 Other specified abnormal findings of blood chemistry: Secondary | ICD-10-CM

## 2021-07-05 DIAGNOSIS — I10 Essential (primary) hypertension: Secondary | ICD-10-CM | POA: Diagnosis not present

## 2021-07-05 DIAGNOSIS — E785 Hyperlipidemia, unspecified: Secondary | ICD-10-CM

## 2021-07-05 DIAGNOSIS — Z23 Encounter for immunization: Secondary | ICD-10-CM | POA: Diagnosis not present

## 2021-07-05 LAB — BAYER DCA HB A1C WAIVED: HB A1C (BAYER DCA - WAIVED): 6.3 % — ABNORMAL HIGH (ref 4.8–5.6)

## 2021-07-05 NOTE — Progress Notes (Signed)
? ?BP (!) 141/78   Pulse 88   Ht 5' 11"  (1.803 m)   Wt (!) 431 lb (195.5 kg)   SpO2 92%   BMI 60.11 kg/m?   ? ?Subjective:  ? ?Patient ID: Nathaniel Martin, male    DOB: 1970/09/19, 51 y.o.   MRN: [id number] ? ?HPI: ?Nathaniel Martin is a 51 y.o. male presenting on 07/05/2021 for Medical Management of Chronic Issues, Prediabetes, Hypertension, and Hyperlipidemia ? ? ?HPI ?Hypertension ?Patient is currently on amlodipine hydrochlorothiazide and losartan, and their blood pressure today is 141/78, borderline. Patient denies any lightheadedness or dizziness. Patient denies headaches, blurred vision, chest pains, shortness of breath, or weakness. Denies any side effects from medication and is content with current medication.  ? ?Prediabetes ?Patient comes in today for recheck of his diabetes. Patient has been currently taking no medicine, diet control. Patient is currently on an ACE inhibitor/ARB. Patient has not seen an ophthalmologist this year. Patient denies any issues with their feet. The symptom started onset as an adult hyperlipidemia and hypertension ARE RELATED TO DM  ? ?Hyperlipidemia ?Patient is coming in for recheck of his hyperlipidemia. The patient is currently taking pravastatin. They deny any issues with myalgias or history of liver damage from it. They deny any focal numbness or weakness or chest pain.  ? ?Patient had slightly elevated liver function on last test, will recheck today ? ?Relevant past medical, surgical, family and social history reviewed and updated as indicated. Interim medical history since our last visit reviewed. ?Allergies and medications reviewed and updated. ? ?Review of Systems  ?Constitutional:  Negative for chills and fever.  ?Eyes:  Negative for visual disturbance.  ?Respiratory:  Negative for shortness of breath and wheezing.   ?Cardiovascular:  Negative for chest pain and leg swelling.  ?Musculoskeletal:  Negative for back pain and gait problem.  ?Skin:  Negative for  rash.  ?Neurological:  Negative for dizziness, weakness and light-headedness.  ?All other systems reviewed and are negative. ? ?Per HPI unless specifically indicated above ? ? ?Allergies as of 07/05/2021   ?No Known Allergies ?  ? ?  ?Medication List  ?  ? ?  ? Accurate as of Jul 05, 2021  9:06 AM. If you have any questions, ask your nurse or doctor.  ?  ?  ? ?  ? ?acetaminophen 500 MG tablet ?Commonly known as: TYLENOL ?Take 1,000 mg by mouth every 6 (six) hours as needed. ?  ?amLODipine 5 MG tablet ?Commonly known as: NORVASC ?Take 1 tablet (5 mg total) by mouth daily. ?  ?ciprofloxacin 500 MG tablet ?Commonly known as: Cipro ?Take 1 tablet (500 mg total) by mouth 2 (two) times daily. For prostate. Take all of these. ?  ?hydrochlorothiazide 25 MG tablet ?Commonly known as: HYDRODIURIL ?Take 1 tablet (25 mg total) by mouth daily. ?  ?losartan 25 MG tablet ?Commonly known as: COZAAR ?Take 1 tablet (25 mg total) by mouth daily. ?  ?meloxicam 15 MG tablet ?Commonly known as: MOBIC ?Take 1 tablet (15 mg total) by mouth daily. ?  ?pravastatin 40 MG tablet ?Commonly known as: PRAVACHOL ?Take 1 tablet (40 mg total) by mouth daily. ?  ? ?  ? ? ? ?Objective:  ? ?BP (!) 141/78   Pulse 88   Ht 5' 11"  (1.803 m)   Wt (!) 431 lb (195.5 kg)   SpO2 92%   BMI 60.11 kg/m?   ?Wt Readings from Last 3 Encounters:  ?07/05/21 (!) 431 lb (195.5  kg)  ?05/17/21 (!) 426 lb 9.6 oz (193.5 kg)  ?03/29/21 (!) 430 lb (195 kg)  ?  ?Physical Exam ?Vitals and nursing note reviewed.  ?Constitutional:   ?   General: He is not in acute distress. ?   Appearance: He is well-developed. He is not diaphoretic.  ?Eyes:  ?   General: No scleral icterus. ?   Conjunctiva/sclera: Conjunctivae normal.  ?Neck:  ?   Thyroid: No thyromegaly.  ?Cardiovascular:  ?   Rate and Rhythm: Normal rate and regular rhythm.  ?   Heart sounds: Normal heart sounds. No murmur heard. ?Pulmonary:  ?   Effort: Pulmonary effort is normal. No respiratory distress.  ?   Breath sounds:  Normal breath sounds. No wheezing.  ?Musculoskeletal:     ?   General: Normal range of motion.  ?Skin: ?   General: Skin is warm and dry.  ?   Findings: No rash.  ?Neurological:  ?   Mental Status: He is alert and oriented to person, place, and time.  ?   Coordination: Coordination normal.  ?Psychiatric:     ?   Behavior: Behavior normal.  ? ? ? ? ?Assessment & Plan:  ? ?Problem List Items Addressed This Visit   ? ?  ? Cardiovascular and Mediastinum  ? Hypertension  ?  ? Other  ? Hyperlipidemia LDL goal <130  ? Prediabetes - Primary  ? Relevant Orders  ? Bayer DCA Hb A1c Waived  ? CMP14+EGFR  ? ?Other Visit Diagnoses   ? ? Elevated liver function tests      ? Relevant Orders  ? BMP8+EGFR  ? CMP14+EGFR  ? Need for shingles vaccine      ? Relevant Orders  ? Varicella-zoster vaccine IM (Shingrix) (Completed)  ? ?  ?  ?A1c looks good at 6.3, still controlled, blood pressure borderline elevated but keep close eye on it at home. ? ?Recheck liver function and other blood work today. ?Follow up plan: ?Return in about 3 months (around 10/05/2021), or if symptoms worsen or fail to improve, for Prediabetes and hypertension and cholesterol. ? ?Counseling provided for all of the vaccine components ?Orders Placed This Encounter  ?Procedures  ? Varicella-zoster vaccine IM (Shingrix)  ? BMP8+EGFR  ? Bayer DCA Hb A1c Waived  ? CMP14+EGFR  ? ? ?Caryl Pina, MD ?[place] ?07/05/2021, 9:06 AM ? ? ? ? ?

## 2021-07-15 LAB — BMP8+EGFR
BUN/Creatinine Ratio: 18 (ref 9–20)
BUN: 16 mg/dL (ref 6–24)
CO2: 22 mmol/L (ref 20–29)
Calcium: 9.2 mg/dL (ref 8.7–10.2)
Creatinine, Ser: 0.89 mg/dL (ref 0.76–1.27)
Glucose: 123 mg/dL — ABNORMAL HIGH (ref 70–99)
eGFR: 104 mL/min/{1.73_m2} (ref >59)

## 2021-10-06 ENCOUNTER — Encounter: Payer: Self-pay | Admitting: Family Medicine

## 2021-10-06 ENCOUNTER — Ambulatory Visit: Payer: BC Managed Care – PPO | Admitting: Family Medicine

## 2021-10-06 VITALS — BP 132/75 | HR 88 | Temp 98.4°F | Ht 71.0 in | Wt >= 6400 oz

## 2021-10-06 DIAGNOSIS — I10 Essential (primary) hypertension: Secondary | ICD-10-CM | POA: Diagnosis not present

## 2021-10-06 DIAGNOSIS — R7303 Prediabetes: Secondary | ICD-10-CM

## 2021-10-06 DIAGNOSIS — E1169 Type 2 diabetes mellitus with other specified complication: Secondary | ICD-10-CM | POA: Diagnosis not present

## 2021-10-06 DIAGNOSIS — Z794 Long term (current) use of insulin: Secondary | ICD-10-CM

## 2021-10-06 DIAGNOSIS — E785 Hyperlipidemia, unspecified: Secondary | ICD-10-CM | POA: Diagnosis not present

## 2021-10-06 LAB — BAYER DCA HB A1C WAIVED: HB A1C (BAYER DCA - WAIVED): 6.5 % — ABNORMAL HIGH (ref 4.8–5.6)

## 2021-10-06 NOTE — Progress Notes (Signed)
BP 132/75   Pulse 88   Temp 98.4 F (36.9 C)   Ht 5\' 11"  (1.803 m)   Wt (!) 429 lb (194.6 kg)   SpO2 96%   BMI 59.83 kg/m    Subjective:   Patient ID: Nathaniel Martin, male    DOB: 1970/09/18, 51 y.o.   MRN: [id number]  HPI: Nathaniel Martin is a 51 y.o. male presenting on 10/06/2021 for Medical Management of Chronic Issues, Hyperlipidemia, Hypertension, and Prediabetes   HPI Type 2 diabetes mellitus Patient comes in today for recheck of his diabetes. Patient has been currently taking no medicine, new diagnosis, A1c 6.5 today. Patient is currently on an ACE inhibitor/ARB. Patient has not seen an ophthalmologist this year. Patient denies any issues with their feet. The symptom started onset as an adult hypertension and hyperlipidemia ARE RELATED TO DM   Hypertension Patient is currently on amlodipine and hydrochlorothiazide and losartan, and their blood pressure today is 132/75. Patient denies any lightheadedness or dizziness. Patient denies headaches, blurred vision, chest pains, shortness of breath, or weakness. Denies any side effects from medication and is content with current medication.   Hyperlipidemia Patient is coming in for recheck of his hyperlipidemia. The patient is currently taking pravastatin. They deny any issues with myalgias or history of liver damage from it. They deny any focal numbness or weakness or chest pain.   Patient's diabetes and hypertension and cholesterol are more complicated by the patient's morbid obesity.  Discussed weight loss and lifestyle modification and exercise with the patient.   Relevant past medical, surgical, family and social history reviewed and updated as indicated. Interim medical history since our last visit reviewed. Allergies and medications reviewed and updated.  Review of Systems  Constitutional:  Negative for chills and fever.  Eyes:  Negative for visual disturbance.  Respiratory:  Negative for shortness of breath and  wheezing.   Cardiovascular:  Negative for chest pain and leg swelling.  Musculoskeletal:  Negative for back pain and gait problem.  Skin:  Negative for rash.  Neurological:  Negative for dizziness, weakness and light-headedness.  All other systems reviewed and are negative.   Per HPI unless specifically indicated above   Allergies as of 10/06/2021   No Known Allergies      Medication List        Accurate as of October 06, 2021 10:59 AM. If you have any questions, ask your nurse or doctor.          STOP taking these medications    ciprofloxacin 500 MG tablet Commonly known as: Cipro Stopped by: October 08, 2021 Bertine Schlottman, MD       TAKE these medications    acetaminophen 500 MG tablet Commonly known as: TYLENOL Take 1,000 mg by mouth every 6 (six) hours as needed.   amLODipine 5 MG tablet Commonly known as: NORVASC Take 1 tablet (5 mg total) by mouth daily.   hydrochlorothiazide 25 MG tablet Commonly known as: HYDRODIURIL Take 1 tablet (25 mg total) by mouth daily.   losartan 25 MG tablet Commonly known as: COZAAR Take 1 tablet (25 mg total) by mouth daily.   meloxicam 15 MG tablet Commonly known as: MOBIC Take 1 tablet (15 mg total) by mouth daily.   pravastatin 40 MG tablet Commonly known as: PRAVACHOL Take 1 tablet (40 mg total) by mouth daily.         Objective:   BP 132/75   Pulse 88   Temp 98.4 F (36.9 C)  Ht 5\' 11"  (1.803 m)   Wt (!) 429 lb (194.6 kg)   SpO2 96%   BMI 59.83 kg/m   Wt Readings from Last 3 Encounters:  10/06/21 (!) 429 lb (194.6 kg)  07/05/21 (!) 431 lb (195.5 kg)  05/17/21 (!) 426 lb 9.6 oz (193.5 kg)    Physical Exam Vitals and nursing note reviewed.  Constitutional:      General: He is not in acute distress.    Appearance: He is well-developed. He is not diaphoretic.  Eyes:     General: No scleral icterus.    Conjunctiva/sclera: Conjunctivae normal.  Neck:     Thyroid: No thyromegaly.  Cardiovascular:     Rate  and Rhythm: Normal rate and regular rhythm.     Heart sounds: Normal heart sounds. No murmur heard. Pulmonary:     Effort: Pulmonary effort is normal. No respiratory distress.     Breath sounds: Normal breath sounds. No wheezing.  Musculoskeletal:        General: Normal range of motion.     Cervical back: Neck supple.  Lymphadenopathy:     Cervical: No cervical adenopathy.  Skin:    General: Skin is warm and dry.     Findings: No rash.  Neurological:     Mental Status: He is alert and oriented to person, place, and time.     Coordination: Coordination normal.  Psychiatric:        Behavior: Behavior normal.       Assessment & Plan:   Problem List Items Addressed This Visit       Cardiovascular and Mediastinum   Hypertension   Relevant Orders   CBC with Differential/Platelet   Lipid panel   Bayer DCA Hb A1c Waived     Endocrine   Type 2 diabetes mellitus with other specified complication (HCC) - Primary   Relevant Orders   CBC with Differential/Platelet   Lipid panel   Bayer DCA Hb A1c Waived     Other   Hyperlipidemia LDL goal <130   Relevant Orders   CBC with Differential/Platelet   Lipid panel   Bayer DCA Hb A1c Waived    A1c 6.5, on the diagnosis of diabetes at this point.  Discussed possibly starting metformin and Ozempic to help with the weight loss and the diabetes, patient wants to try diet again and declined the medicine at this point.  Blood pressure looks good, discussed weight and diet and morbid obesity. Follow up plan: Return in about 3 months (around 01/06/2022), or if symptoms worsen or fail to improve, for Hypertension and diabetes and hyperlipidemia.  Counseling provided for all of the vaccine components Orders Placed This Encounter  Procedures   CBC with Differential/Platelet   Lipid panel   Bayer DCA Hb A1c Waived    13/04/2021, MD [facility] 10/06/2021, 10:59 AM

## 2021-10-07 LAB — LIPID PANEL
Chol/HDL Ratio: 3.4 ratio (ref 0.0–5.0)
Cholesterol, Total: 126 mg/dL (ref 100–199)
HDL: 37 mg/dL — ABNORMAL LOW (ref >39)
LDL Chol Calc (NIH): 72 mg/dL (ref 0–99)
Triglycerides: 90 mg/dL (ref 0–149)
VLDL Cholesterol Cal: 17 mg/dL (ref 5–40)

## 2021-10-07 LAB — CBC WITH DIFFERENTIAL/PLATELET
Basophils Absolute: 0 10*3/uL (ref 0.0–0.2)
Basos: 1 %
EOS (ABSOLUTE): 0.1 10*3/uL (ref 0.0–0.4)
Eos: 2 %
Hematocrit: 41.1 % (ref 37.5–51.0)
Hemoglobin: 13.4 g/dL (ref 13.0–17.7)
Immature Grans (Abs): 0 10*3/uL (ref 0.0–0.1)
Immature Granulocytes: 0 %
Lymphocytes Absolute: 1.5 10*3/uL (ref 0.7–3.1)
Lymphs: 24 %
MCH: 29.6 pg (ref 26.6–33.0)
MCHC: 32.6 g/dL (ref 31.5–35.7)
MCV: 91 fL (ref 79–97)
Monocytes Absolute: 0.7 10*3/uL (ref 0.1–0.9)
Monocytes: 12 %
Neutrophils Absolute: 3.8 10*3/uL (ref 1.4–7.0)
Neutrophils: 61 %
Platelets: 250 10*3/uL (ref 150–450)
RBC: 4.52 x10E6/uL (ref 4.14–5.80)
RDW: 12.5 % (ref 11.6–15.4)
WBC: 6.2 10*3/uL (ref 3.4–10.8)

## 2022-01-03 ENCOUNTER — Ambulatory Visit: Payer: BC Managed Care – PPO | Admitting: Family Medicine

## 2022-01-03 ENCOUNTER — Encounter: Payer: Self-pay | Admitting: Family Medicine

## 2022-01-03 VITALS — BP 137/75 | HR 93 | Ht 71.0 in | Wt >= 6400 oz

## 2022-01-03 DIAGNOSIS — Z794 Long term (current) use of insulin: Secondary | ICD-10-CM

## 2022-01-03 DIAGNOSIS — E785 Hyperlipidemia, unspecified: Secondary | ICD-10-CM

## 2022-01-03 DIAGNOSIS — Z23 Encounter for immunization: Secondary | ICD-10-CM

## 2022-01-03 DIAGNOSIS — E1169 Type 2 diabetes mellitus with other specified complication: Secondary | ICD-10-CM | POA: Diagnosis not present

## 2022-01-03 DIAGNOSIS — I10 Essential (primary) hypertension: Secondary | ICD-10-CM

## 2022-01-03 LAB — BAYER DCA HB A1C WAIVED: HB A1C (BAYER DCA - WAIVED): 7.1 % — ABNORMAL HIGH (ref 4.8–5.6)

## 2022-01-03 MED ORDER — METFORMIN HCL 500 MG PO TABS: 500.0000 mg | ORAL_TABLET | Freq: Two times a day (BID) | ORAL | 3 refills | Status: AC

## 2022-01-03 MED ORDER — SEMAGLUTIDE(0.25 OR 0.5MG/DOS) 2 MG/3ML ~~LOC~~ SOPN: 0.2500 mg | PEN_INJECTOR | SUBCUTANEOUS | 3 refills | Status: AC

## 2022-01-03 NOTE — Progress Notes (Signed)
BP 137/75   Pulse 93   Ht _0  (1.803 m)   Wt (!) 432 lb (196 kg)   SpO2 93%   BMI 60.25 kg/m    Subjective:   Patient ID: Nathaniel Martin, male    DOB: [birth year], 51 y.o.   MRN: [id number]  HPI: Nathaniel Martin is a 51 y.o. male presenting on 01/03/2022 for Medical Management of Chronic Issues   HPI Type 2 diabetes mellitus Patient comes in today for recheck of his diabetes. Patient has been currently taking the medicine currently, trying diet control. Patient is currently on an ACE inhibitor/ARB. Patient has not seen an ophthalmologist this year. Patient denies any issues with their feet. The symptom started onset as an adult hypertension and hyperlipidemia ARE RELATED TO DM   Hypertension Patient is currently on amlodipine and losartan and hydrochlorothiazide, and their blood pressure today is 163/80. Patient denies any lightheadedness or dizziness. Patient denies headaches, blurred vision, chest pains, shortness of breath, or weakness. Denies any side effects from medication and is content with current medication.   Hyperlipidemia Patient is coming in for recheck of his hyperlipidemia. The patient is currently taking pravastatin. They deny any issues with myalgias or history of liver damage from it. They deny any focal numbness or weakness or chest pain.   Relevant past medical, surgical, family and social history reviewed and updated as indicated. Interim medical history since our last visit reviewed. Allergies and medications reviewed and updated.  Review of Systems  Constitutional:  Negative for chills and fever.  Eyes:  Negative for visual disturbance.  Respiratory:  Negative for shortness of breath and wheezing.   Cardiovascular:  Negative for chest pain and leg swelling.  Musculoskeletal:  Negative for back pain and gait problem.  Skin:  Negative for rash.  All other systems reviewed and are negative.   Per HPI unless specifically indicated  above   Allergies as of 01/03/2022   No Known Allergies      Medication List        Accurate as of January 03, 2022  9:03 AM. If you have any questions, ask your nurse or doctor.          acetaminophen 500 MG tablet Commonly known as: TYLENOL Take 1,000 mg by mouth every 6 (six) hours as needed.   amLODipine 5 MG tablet Commonly known as: NORVASC Take 1 tablet (5 mg total) by mouth daily.   hydrochlorothiazide 25 MG tablet Commonly known as: HYDRODIURIL Take 1 tablet (25 mg total) by mouth daily.   losartan 25 MG tablet Commonly known as: COZAAR Take 1 tablet (25 mg total) by mouth daily.   meloxicam 15 MG tablet Commonly known as: MOBIC Take 1 tablet (15 mg total) by mouth daily.   metFORMIN 500 MG tablet Commonly known as: GLUCOPHAGE Take 1 tablet (500 mg total) by mouth 2 (two) times daily with a meal. Started by: Fransisca Kaufmann Elanore Talcott, MD   pravastatin 40 MG tablet Commonly known as: PRAVACHOL Take 1 tablet (40 mg total) by mouth daily.   Semaglutide(0.25 or 0.5MG/DOS) 2 MG/3ML Sopn Inject 0.25 mg into the skin once a week. Started by: Fransisca Kaufmann Alette Kataoka, MD         Objective:   BP 137/75   Pulse 93   Ht _1  (1.803 m)   Wt (!) 432 lb (196 kg)   SpO2 93%   BMI 60.25 kg/m   Wt Readings from Last 3 Encounters:  01/03/22 Marland Kitchen)  432 lb (196 kg)  10/06/21 (!) 429 lb (194.6 kg)  07/05/21 (!) 431 lb (195.5 kg)    Physical Exam Vitals and nursing note reviewed.  Constitutional:      General: He is not in acute distress.    Appearance: He is well-developed. He is obese. He is not diaphoretic.  Eyes:     General: No scleral icterus.    Conjunctiva/sclera: Conjunctivae normal.  Neck:     Thyroid: No thyromegaly.  Cardiovascular:     Rate and Rhythm: Normal rate and regular rhythm.     Heart sounds: Normal heart sounds. No murmur heard. Pulmonary:     Effort: Pulmonary effort is normal. No respiratory distress.     Breath sounds: Normal breath  sounds. No wheezing.  Musculoskeletal:        General: No swelling. Normal range of motion.     Cervical back: Neck supple.  Lymphadenopathy:     Cervical: No cervical adenopathy.  Skin:    General: Skin is warm and dry.     Findings: No rash.  Neurological:     Mental Status: He is alert and oriented to person, place, and time.     Coordination: Coordination normal.  Psychiatric:        Behavior: Behavior normal.       Assessment & Plan:   Problem List Items Addressed This Visit       Cardiovascular and Mediastinum   Hypertension     Endocrine   Type 2 diabetes mellitus with other specified complication (Accident) - Primary   Relevant Medications   metFORMIN (GLUCOPHAGE) 500 MG tablet   Semaglutide,0.25 or 0.5MG/DOS, 2 MG/3ML SOPN   Other Relevant Orders   Lipid panel   CMP14+EGFR   Bayer DCA Hb A1c Waived     Other   Hyperlipidemia LDL goal <130   Other Visit Diagnoses     Need for immunization against influenza       Relevant Orders   Flu Vaccine QUAD 27moIM (Fluarix, Fluzone & Alfiuria Quad PF) (Completed)     A1c has crept up again at 7.1, will start metformin and Ozempic  Blood pressure elevated today but normally runs good on her past few visits, will monitor at home and he will call some numbers in and continue current medicine for that  Follow up plan: Return in about 3 months (around 04/05/2022), or if symptoms worsen or fail to improve, for Diabetes hypertension and cholesterol.  Counseling provided for all of the vaccine components Orders Placed This Encounter  Procedures   Flu Vaccine QUAD 679moM (Fluarix, Fluzone & Alfiuria Quad PF)   Lipid panel   CMP14+EGFR   Bayer DCA Hb A1c Waived    JoCaryl PinaMD [facility] 01/03/2022, 9:03 AM

## 2022-01-04 LAB — CMP14+EGFR
ALT: 83 IU/L — ABNORMAL HIGH (ref 0–44)
AST: 121 IU/L — ABNORMAL HIGH (ref 0–40)
Albumin/Globulin Ratio: 1 — ABNORMAL LOW (ref 1.2–2.2)
Albumin: 3.8 g/dL — ABNORMAL LOW (ref 4.1–5.1)
Alkaline Phosphatase: 124 IU/L — ABNORMAL HIGH (ref 44–121)
BUN/Creatinine Ratio: 16 (ref 9–20)
BUN: 15 mg/dL (ref 6–24)
Bilirubin Total: 0.2 mg/dL (ref 0.0–1.2)
CO2: 22 mmol/L (ref 20–29)
Calcium: 8.9 mg/dL (ref 8.7–10.2)
Chloride: 102 mmol/L (ref 96–106)
Creatinine, Ser: 0.91 mg/dL (ref 0.76–1.27)
Globulin, Total: 3.7 g/dL (ref 1.5–4.5)
Glucose: 144 mg/dL — ABNORMAL HIGH (ref 70–99)
Potassium: 4.3 mmol/L (ref 3.5–5.2)
Sodium: 138 mmol/L (ref 134–144)
Total Protein: 7.5 g/dL (ref 6.0–8.5)
eGFR: 103 mL/min/{1.73_m2} (ref >59)

## 2022-01-04 LAB — LIPID PANEL
Chol/HDL Ratio: 3.5 ratio (ref 0.0–5.0)
Cholesterol, Total: 118 mg/dL (ref 100–199)
HDL: 34 mg/dL — ABNORMAL LOW (ref >39)
LDL Chol Calc (NIH): 64 mg/dL (ref 0–99)
Triglycerides: 106 mg/dL (ref 0–149)
VLDL Cholesterol Cal: 20 mg/dL (ref 5–40)

## 2022-03-23 ENCOUNTER — Other Ambulatory Visit: Payer: Self-pay | Admitting: Family Medicine

## 2022-03-23 DIAGNOSIS — I10 Essential (primary) hypertension: Secondary | ICD-10-CM

## 2022-04-07 ENCOUNTER — Encounter: Payer: Self-pay | Admitting: Family Medicine

## 2022-04-07 ENCOUNTER — Ambulatory Visit: Payer: BC Managed Care – PPO | Admitting: Family Medicine

## 2022-04-07 VITALS — BP 151/98 | HR 115 | Ht 71.0 in | Wt 391.0 lb

## 2022-04-07 DIAGNOSIS — I10 Essential (primary) hypertension: Secondary | ICD-10-CM | POA: Diagnosis not present

## 2022-04-07 DIAGNOSIS — K2900 Acute gastritis without bleeding: Secondary | ICD-10-CM

## 2022-04-07 DIAGNOSIS — E1169 Type 2 diabetes mellitus with other specified complication: Secondary | ICD-10-CM | POA: Diagnosis not present

## 2022-04-07 DIAGNOSIS — E785 Hyperlipidemia, unspecified: Secondary | ICD-10-CM | POA: Diagnosis not present

## 2022-04-07 DIAGNOSIS — R52 Pain, unspecified: Secondary | ICD-10-CM

## 2022-04-07 DIAGNOSIS — R1112 Projectile vomiting: Secondary | ICD-10-CM

## 2022-04-07 DIAGNOSIS — Z794 Long term (current) use of insulin: Secondary | ICD-10-CM

## 2022-04-07 LAB — VERITOR FLU A/B WAIVED
Influenza A: NEGATIVE
Influenza B: NEGATIVE

## 2022-04-07 LAB — BAYER DCA HB A1C WAIVED: HB A1C (BAYER DCA - WAIVED): 6.3 % — ABNORMAL HIGH (ref 4.8–5.6)

## 2022-04-07 MED ORDER — ONDANSETRON 4 MG PO TBDP: 4.0000 mg | ORAL_TABLET | Freq: Once | ORAL | Status: AC

## 2022-04-07 MED ORDER — ONDANSETRON HCL 4 MG PO TABS: 4.0000 mg | ORAL_TABLET | Freq: Three times a day (TID) | ORAL | 1 refills | Status: AC | PRN

## 2022-04-07 NOTE — Progress Notes (Signed)
BP (!) 151/98   Pulse (!) 115   Ht 5\' 11"  (1.803 m)   Wt (!) 391 lb (177.4 kg)   SpO2 97%   BMI 54.53 kg/m    Subjective:   Patient ID: Nathaniel Martin, male    DOB: 03/05/1971, 52 y.o.   MRN: [id number]  HPI: FRANCE NOYCE is a 52 y.o. male presenting on 04/07/2022 for Medical Management of Chronic Issues, Diabetes, Hypertension, Nausea (And vomiting), and Generalized Body Aches   HPI Nausea and vomiting Patient started with nausea and vomiting this morning.  He denies any diarrhea.  He did have some upper abdominal pain but then had a bowel movement and felt a little bit better.  He denies any fevers but has had some chills.  Type 2 diabetes mellitus Patient comes in today for recheck of his diabetes. Patient has been currently taking metformin and Ozempic. Patient is currently on an ACE inhibitor/ARB. Patient has not seen an ophthalmologist this year. Patient denies any issues with their feet. The symptom started onset as an adult hypertension and hyperlipidemia ARE RELATED TO DM   Hypertension Patient is currently on amlodipine and hydrochlorothiazide and losartan, and their blood pressure today is 151/98 but is feeling ill and vomiting here in the office. Patient denies any lightheadedness or dizziness. Patient denies headaches, blurred vision, chest pains, shortness of breath, or weakness. Denies any side effects from medication and is content with current medication.   Hyperlipidemia Patient is coming in for recheck of his hyperlipidemia. The patient is currently taking pravastatin. They deny any issues with myalgias or history of liver damage from it. They deny any focal numbness or weakness or chest pain.   Relevant past medical, surgical, family and social history reviewed and updated as indicated. Interim medical history since our last visit reviewed. Allergies and medications reviewed and updated.  Review of Systems  Constitutional:  Negative for chills and  fever.  Eyes:  Negative for visual disturbance.  Respiratory:  Negative for shortness of breath and wheezing.   Cardiovascular:  Negative for chest pain and leg swelling.  Gastrointestinal:  Positive for abdominal pain, nausea and vomiting. Negative for blood in stool, constipation and diarrhea.  Musculoskeletal:  Negative for back pain and gait problem.  Skin:  Negative for rash.  Neurological:  Negative for dizziness, weakness and light-headedness.  All other systems reviewed and are negative.   Per HPI unless specifically indicated above   Allergies as of 04/07/2022   No Known Allergies      Medication List        Accurate as of April 07, 2022 11:04 AM. If you have any questions, ask your nurse or doctor.          acetaminophen 500 MG tablet Commonly known as: TYLENOL Take 1,000 mg by mouth every 6 (six) hours as needed.   amLODipine 5 MG tablet Commonly known as: NORVASC TAKE 1 TABLET BY MOUTH EVERY DAY   hydrochlorothiazide 25 MG tablet Commonly known as: HYDRODIURIL Take 1 tablet (25 mg total) by mouth daily.   losartan 25 MG tablet Commonly known as: COZAAR Take 1 tablet (25 mg total) by mouth daily.   meloxicam 15 MG tablet Commonly known as: MOBIC Take 1 tablet (15 mg total) by mouth daily.   metFORMIN 500 MG tablet Commonly known as: GLUCOPHAGE Take 1 tablet (500 mg total) by mouth 2 (two) times daily with a meal.   ondansetron 4 MG tablet Commonly known as: Zofran  Take 1 tablet (4 mg total) by mouth every 8 (eight) hours as needed for nausea or vomiting. Started by: Fransisca Kaufmann Aniruddh Ciavarella, MD   pravastatin 40 MG tablet Commonly known as: PRAVACHOL Take 1 tablet (40 mg total) by mouth daily.   Semaglutide(0.25 or 0.5MG /DOS) 2 MG/3ML Sopn Inject 0.25 mg into the skin once a week.         Objective:   BP (!) 151/98   Pulse (!) 115   Ht 5\' 11"  (1.803 m)   Wt (!) 391 lb (177.4 kg)   SpO2 97%   BMI 54.53 kg/m   Wt Readings from Last 3  Encounters:  04/07/22 (!) 391 lb (177.4 kg)  01/03/22 (!) 432 lb (196 kg)  10/06/21 (!) 429 lb (194.6 kg)    Physical Exam Vitals and nursing note reviewed.  Constitutional:      General: He is not in acute distress.    Appearance: He is well-developed. He is not diaphoretic.  Eyes:     General: No scleral icterus.    Conjunctiva/sclera: Conjunctivae normal.  Neck:     Thyroid: No thyromegaly.  Cardiovascular:     Rate and Rhythm: Normal rate and regular rhythm.     Heart sounds: Normal heart sounds. No murmur heard. Pulmonary:     Effort: Pulmonary effort is normal. No respiratory distress.     Breath sounds: Normal breath sounds. No wheezing.  Abdominal:     General: Abdomen is flat. Bowel sounds are normal. There is no distension.     Tenderness: There is no abdominal tenderness. There is no right CVA tenderness, left CVA tenderness, guarding or rebound.     Hernia: No hernia is present.  Musculoskeletal:     Cervical back: Neck supple.  Lymphadenopathy:     Cervical: No cervical adenopathy.  Skin:    General: Skin is warm and dry.     Findings: No rash.  Neurological:     Mental Status: He is alert and oriented to person, place, and time.     Coordination: Coordination normal.  Psychiatric:        Behavior: Behavior normal.       Assessment & Plan:   Problem List Items Addressed This Visit       Cardiovascular and Mediastinum   Hypertension   Relevant Orders   CBC with Differential/Platelet   CMP14+EGFR   Bayer DCA Hb A1c Waived     Endocrine   Type 2 diabetes mellitus with other specified complication (HCC) - Primary   Relevant Orders   CBC with Differential/Platelet   CMP14+EGFR   Bayer DCA Hb A1c Waived     Other   Hyperlipidemia LDL goal <130   Relevant Orders   CBC with Differential/Platelet   CMP14+EGFR   Bayer DCA Hb A1c Waived   Other Visit Diagnoses     Body aches       Relevant Medications   ondansetron (ZOFRAN-ODT) disintegrating  tablet 4 mg   ondansetron (ZOFRAN) 4 MG tablet   Other Relevant Orders   Veritor Flu A/B Waived   Novel Coronavirus, NAA ([facility])   Projectile vomiting with nausea       Relevant Medications   ondansetron (ZOFRAN-ODT) disintegrating tablet 4 mg   ondansetron (ZOFRAN) 4 MG tablet   Other Relevant Orders   Veritor Flu A/B Waived   Novel Coronavirus, NAA ([facility])   Acute gastritis without hemorrhage, unspecified gastritis type       Relevant Medications   ondansetron (  ZOFRAN) 4 MG tablet     A1c looks good at 6.3.  Goes to 1 February 1 May-the ibuprofen yes okay I am and that he okay but I think it is in stable and the flu was negative and I sent him Zofran for him, likely gastroenteritis.  Blood pressure likely elevated because of GI symptoms and vomiting here in the office.  No other changes.  Should be 24 hours but it should go away on its own.  Follow up plan: Return in about 3 months (around 07/06/2022), or if symptoms worsen or fail to improve, for Diabetes hypertension and cholesterol.  Counseling provided for all of the vaccine components Orders Placed This Encounter  Procedures   Novel Coronavirus, NAA ([facility])   CBC with Differential/Platelet   CMP14+EGFR   Bayer DCA Hb A1c Waived   Veritor Flu A/B Hamilton Lakera Viall, MD [facility] 04/07/2022, 11:04 AM

## 2022-04-08 ENCOUNTER — Encounter (HOSPITAL_COMMUNITY): Admission: EM | Disposition: E | Payer: Self-pay | Source: Home / Self Care | Attending: Internal Medicine

## 2022-04-08 ENCOUNTER — Emergency Department (HOSPITAL_COMMUNITY): Payer: BC Managed Care – PPO

## 2022-04-08 ENCOUNTER — Inpatient Hospital Stay (HOSPITAL_COMMUNITY): Payer: BC Managed Care – PPO | Admitting: Certified Registered"

## 2022-04-08 ENCOUNTER — Inpatient Hospital Stay (HOSPITAL_COMMUNITY): Payer: BC Managed Care – PPO

## 2022-04-08 ENCOUNTER — Other Ambulatory Visit: Payer: Self-pay

## 2022-04-08 ENCOUNTER — Telehealth: Payer: Self-pay

## 2022-04-08 ENCOUNTER — Inpatient Hospital Stay (HOSPITAL_COMMUNITY)
Admission: EM | Admit: 2022-04-08 | Discharge: 2022-05-06 | DRG: 853 | Disposition: E | Payer: BC Managed Care – PPO | Attending: Internal Medicine | Admitting: Internal Medicine

## 2022-04-08 DIAGNOSIS — Z1152 Encounter for screening for COVID-19: Secondary | ICD-10-CM

## 2022-04-08 DIAGNOSIS — E1169 Type 2 diabetes mellitus with other specified complication: Secondary | ICD-10-CM | POA: Diagnosis present

## 2022-04-08 DIAGNOSIS — Z79899 Other long term (current) drug therapy: Secondary | ICD-10-CM | POA: Diagnosis not present

## 2022-04-08 DIAGNOSIS — R112 Nausea with vomiting, unspecified: Secondary | ICD-10-CM | POA: Diagnosis present

## 2022-04-08 DIAGNOSIS — Z8249 Family history of ischemic heart disease and other diseases of the circulatory system: Secondary | ICD-10-CM

## 2022-04-08 DIAGNOSIS — Z833 Family history of diabetes mellitus: Secondary | ICD-10-CM | POA: Diagnosis not present

## 2022-04-08 DIAGNOSIS — Z6841 Body Mass Index (BMI) 40.0 and over, adult: Secondary | ICD-10-CM | POA: Diagnosis not present

## 2022-04-08 DIAGNOSIS — I10 Essential (primary) hypertension: Secondary | ICD-10-CM | POA: Diagnosis present

## 2022-04-08 DIAGNOSIS — E785 Hyperlipidemia, unspecified: Secondary | ICD-10-CM | POA: Diagnosis present

## 2022-04-08 DIAGNOSIS — J9601 Acute respiratory failure with hypoxia: Secondary | ICD-10-CM | POA: Diagnosis not present

## 2022-04-08 DIAGNOSIS — R7401 Elevation of levels of liver transaminase levels: Secondary | ICD-10-CM | POA: Diagnosis present

## 2022-04-08 DIAGNOSIS — H538 Other visual disturbances: Secondary | ICD-10-CM | POA: Diagnosis present

## 2022-04-08 DIAGNOSIS — N179 Acute kidney failure, unspecified: Secondary | ICD-10-CM | POA: Diagnosis present

## 2022-04-08 DIAGNOSIS — R197 Diarrhea, unspecified: Secondary | ICD-10-CM | POA: Diagnosis present

## 2022-04-08 DIAGNOSIS — I469 Cardiac arrest, cause unspecified: Secondary | ICD-10-CM | POA: Diagnosis not present

## 2022-04-08 DIAGNOSIS — N139 Obstructive and reflux uropathy, unspecified: Secondary | ICD-10-CM | POA: Diagnosis present

## 2022-04-08 DIAGNOSIS — R6521 Severe sepsis with septic shock: Secondary | ICD-10-CM | POA: Diagnosis present

## 2022-04-08 DIAGNOSIS — R42 Dizziness and giddiness: Secondary | ICD-10-CM | POA: Diagnosis present

## 2022-04-08 DIAGNOSIS — Q5564 Hidden penis: Secondary | ICD-10-CM | POA: Diagnosis not present

## 2022-04-08 DIAGNOSIS — N136 Pyonephrosis: Secondary | ICD-10-CM | POA: Diagnosis present

## 2022-04-08 DIAGNOSIS — Z7984 Long term (current) use of oral hypoglycemic drugs: Secondary | ICD-10-CM | POA: Diagnosis not present

## 2022-04-08 DIAGNOSIS — E8729 Other acidosis: Secondary | ICD-10-CM | POA: Diagnosis present

## 2022-04-08 DIAGNOSIS — Z791 Long term (current) use of non-steroidal anti-inflammatories (NSAID): Secondary | ICD-10-CM

## 2022-04-08 DIAGNOSIS — N200 Calculus of kidney: Secondary | ICD-10-CM

## 2022-04-08 DIAGNOSIS — E662 Morbid (severe) obesity with alveolar hypoventilation: Secondary | ICD-10-CM | POA: Diagnosis present

## 2022-04-08 DIAGNOSIS — A419 Sepsis, unspecified organism: Secondary | ICD-10-CM | POA: Diagnosis not present

## 2022-04-08 HISTORY — PX: CYSTOSCOPY WITH STENT PLACEMENT: SHX5790

## 2022-04-08 LAB — POCT I-STAT 7, (LYTES, BLD GAS, ICA,H+H)
Acid-base deficit: 14 mmol/L — ABNORMAL HIGH (ref 0.0–2.0)
Acid-base deficit: 15 mmol/L — ABNORMAL HIGH (ref 0.0–2.0)
Acid-base deficit: 8 mmol/L — ABNORMAL HIGH (ref 0.0–2.0)
Bicarbonate: 16.9 mmol/L — ABNORMAL LOW (ref 20.0–28.0)
Bicarbonate: 18.9 mmol/L — ABNORMAL LOW (ref 20.0–28.0)
Bicarbonate: 26 mmol/L (ref 20.0–28.0)
Calcium, Ion: 1.43 mmol/L — ABNORMAL HIGH (ref 1.15–1.40)
Calcium, Ion: 1.92 mmol/L (ref 1.15–1.40)
Calcium, Ion: 1.13 mmol/L — ABNORMAL LOW (ref 1.15–1.40)
HCT: 40 % (ref 39.0–52.0)
HCT: 40 % (ref 39.0–52.0)
HCT: 38 % — ABNORMAL LOW (ref 39.0–52.0)
Hemoglobin: 13.6 g/dL (ref 13.0–17.0)
Hemoglobin: 13.6 g/dL (ref 13.0–17.0)
Hemoglobin: 12.9 g/dL — ABNORMAL LOW (ref 13.0–17.0)
O2 Saturation: 60 %
O2 Saturation: 66 %
O2 Saturation: 61 %
Potassium: 3.8 mmol/L (ref 3.5–5.1)
Potassium: 4.1 mmol/L (ref 3.5–5.1)
Potassium: 4.7 mmol/L (ref 3.5–5.1)
Sodium: 141 mmol/L (ref 135–145)
Sodium: 143 mmol/L (ref 135–145)
Sodium: 153 mmol/L — ABNORMAL HIGH (ref 135–145)
TCO2: 19 mmol/L — ABNORMAL LOW (ref 22–32)
TCO2: 21 mmol/L — ABNORMAL LOW (ref 22–32)
TCO2: 29 mmol/L (ref 22–32)
pCO2 arterial: 64 mmHg — ABNORMAL HIGH (ref 32–48)
pCO2 arterial: 84.7 mmHg (ref 32–48)
pCO2 arterial: 109.8 mmHg (ref 32–48)
pH, Arterial: 6.956 — CL (ref 7.35–7.45)
pH, Arterial: 7.031 — CL (ref 7.35–7.45)
pH, Arterial: 6.983 — CL (ref 7.35–7.45)
pO2, Arterial: 45 mmHg — ABNORMAL LOW (ref 83–108)
pO2, Arterial: 55 mmHg — ABNORMAL LOW (ref 83–108)
pO2, Arterial: 51 mmHg — ABNORMAL LOW (ref 83–108)

## 2022-04-08 LAB — URINALYSIS, W/ REFLEX TO CULTURE (INFECTION SUSPECTED)
Bilirubin Urine: NEGATIVE
Glucose, UA: NEGATIVE mg/dL
Ketones, ur: NEGATIVE mg/dL
Nitrite: NEGATIVE
Protein, ur: 100 mg/dL — AB
Specific Gravity, Urine: 1.023 (ref 1.005–1.030)
WBC, UA: >50 WBC/hpf (ref 0–5)
pH: 5 (ref 5.0–8.0)

## 2022-04-08 LAB — CMP14+EGFR
ALT: 71 IU/L — ABNORMAL HIGH (ref 0–44)
AST: 77 IU/L — ABNORMAL HIGH (ref 0–40)
Albumin/Globulin Ratio: 1.3 (ref 1.2–2.2)
Albumin: 4.3 g/dL (ref 4.1–5.1)
Alkaline Phosphatase: 101 IU/L (ref 44–121)
BUN/Creatinine Ratio: 14 (ref 9–20)
BUN: 16 mg/dL (ref 6–24)
Bilirubin Total: 0.4 mg/dL (ref 0.0–1.2)
CO2: 24 mmol/L (ref 20–29)
Calcium: 10 mg/dL (ref 8.7–10.2)
Chloride: 101 mmol/L (ref 96–106)
Creatinine, Ser: 1.11 mg/dL (ref 0.76–1.27)
Globulin, Total: 3.3 g/dL (ref 1.5–4.5)
Glucose: 110 mg/dL — ABNORMAL HIGH (ref 70–99)
Potassium: 3.9 mmol/L (ref 3.5–5.2)
Sodium: 141 mmol/L (ref 134–144)
Total Protein: 7.6 g/dL (ref 6.0–8.5)
eGFR: 81 mL/min/{1.73_m2} (ref >59)

## 2022-04-08 LAB — I-STAT VENOUS BLOOD GAS, ED
Acid-base deficit: 13 mmol/L — ABNORMAL HIGH (ref 0.0–2.0)
Bicarbonate: 17.2 mmol/L — ABNORMAL LOW (ref 20.0–28.0)
Calcium, Ion: 1.05 mmol/L — ABNORMAL LOW (ref 1.15–1.40)
HCT: 38 % — ABNORMAL LOW (ref 39.0–52.0)
Hemoglobin: 12.9 g/dL — ABNORMAL LOW (ref 13.0–17.0)
O2 Saturation: 52 %
Potassium: 4.4 mmol/L (ref 3.5–5.1)
Sodium: 135 mmol/L (ref 135–145)
TCO2: 19 mmol/L — ABNORMAL LOW (ref 22–32)
pCO2, Ven: 59.3 mmHg (ref 44–60)
pH, Ven: 7.07 — CL (ref 7.25–7.43)
pO2, Ven: 39 mmHg (ref 32–45)

## 2022-04-08 LAB — RESP PANEL BY RT-PCR (RSV, FLU A&B, COVID)  RVPGX2
Influenza A by PCR: NEGATIVE
Influenza B by PCR: NEGATIVE
Resp Syncytial Virus by PCR: NEGATIVE
SARS Coronavirus 2 by RT PCR: NEGATIVE

## 2022-04-08 LAB — NOVEL CORONAVIRUS, NAA: SARS-CoV-2, NAA: NOT DETECTED

## 2022-04-08 LAB — COMPREHENSIVE METABOLIC PANEL
ALT: 62 U/L — ABNORMAL HIGH (ref 0–44)
AST: 91 U/L — ABNORMAL HIGH (ref 15–41)
Albumin: 3.2 g/dL — ABNORMAL LOW (ref 3.5–5.0)
Alkaline Phosphatase: 76 U/L (ref 38–126)
Anion gap: 18 — ABNORMAL HIGH (ref 5–15)
BUN: 29 mg/dL — ABNORMAL HIGH (ref 6–20)
CO2: 18 mmol/L — ABNORMAL LOW (ref 22–32)
Calcium: 9.1 mg/dL (ref 8.9–10.3)
Chloride: 99 mmol/L (ref 98–111)
Creatinine, Ser: 4.48 mg/dL — ABNORMAL HIGH (ref 0.61–1.24)
GFR, Estimated: 15 mL/min — ABNORMAL LOW (ref >60)
Glucose, Bld: 117 mg/dL — ABNORMAL HIGH (ref 70–99)
Potassium: 4.6 mmol/L (ref 3.5–5.1)
Sodium: 135 mmol/L (ref 135–145)
Total Bilirubin: 1.4 mg/dL — ABNORMAL HIGH (ref 0.3–1.2)
Total Protein: 7.5 g/dL (ref 6.5–8.1)

## 2022-04-08 LAB — CBC WITH DIFFERENTIAL/PLATELET
Basophils Absolute: 0 10*3/uL (ref 0.0–0.2)
Basos: 0 %
EOS (ABSOLUTE): 0 10*3/uL (ref 0.0–0.4)
Eos: 1 %
Hematocrit: 42.8 % (ref 37.5–51.0)
Hemoglobin: 14.4 g/dL (ref 13.0–17.7)
Immature Grans (Abs): 0.1 10*3/uL (ref 0.0–0.1)
Immature Granulocytes: 1 %
Lymphocytes Absolute: 0.6 10*3/uL — ABNORMAL LOW (ref 0.7–3.1)
Lymphs: 8 %
MCH: 31.1 pg (ref 26.6–33.0)
MCHC: 33.6 g/dL (ref 31.5–35.7)
MCV: 92 fL (ref 79–97)
Monocytes Absolute: 0 10*3/uL — ABNORMAL LOW (ref 0.1–0.9)
Monocytes: 1 %
Neutrophils Absolute: 5.8 10*3/uL (ref 1.4–7.0)
Neutrophils: 89 %
Platelets: 201 10*3/uL (ref 150–450)
RBC: 4.63 x10E6/uL (ref 4.14–5.80)
RDW: 12.9 % (ref 11.6–15.4)
WBC: 6.5 10*3/uL (ref 3.4–10.8)

## 2022-04-08 LAB — CBC
HCT: 42.8 % (ref 39.0–52.0)
Hemoglobin: 14 g/dL (ref 13.0–17.0)
MCH: 31.4 pg (ref 26.0–34.0)
MCHC: 32.7 g/dL (ref 30.0–36.0)
MCV: 96 fL (ref 80.0–100.0)
Platelets: 104 10*3/uL — ABNORMAL LOW (ref 150–400)
RBC: 4.46 MIL/uL (ref 4.22–5.81)
RDW: 14.2 % (ref 11.5–15.5)
WBC: 26.8 10*3/uL — ABNORMAL HIGH (ref 4.0–10.5)
nRBC: 0.1 % (ref 0.0–0.2)

## 2022-04-08 LAB — PROTIME-INR
INR: 1.5 — ABNORMAL HIGH (ref 0.8–1.2)
Prothrombin Time: 18 seconds — ABNORMAL HIGH (ref 11.4–15.2)

## 2022-04-08 LAB — APTT: aPTT: 38 seconds — ABNORMAL HIGH (ref 24–36)

## 2022-04-08 LAB — TROPONIN I (HIGH SENSITIVITY)
Troponin I (High Sensitivity): 85 ng/L — ABNORMAL HIGH (ref <18)
Troponin I (High Sensitivity): 61 ng/L — ABNORMAL HIGH (ref <18)

## 2022-04-08 LAB — LIPASE, BLOOD: Lipase: 25 U/L (ref 11–51)

## 2022-04-08 LAB — LACTIC ACID, PLASMA
Lactic Acid, Venous: 7.5 mmol/L (ref 0.5–1.9)
Lactic Acid, Venous: 8 mmol/L (ref 0.5–1.9)

## 2022-04-08 SURGERY — CYSTOSCOPY, WITH STENT INSERTION
Anesthesia: General | Laterality: Right

## 2022-04-08 MED ORDER — SODIUM BICARBONATE 8.4 % IV SOLN
50.0000 meq | Freq: Once | INTRAVENOUS | Status: AC
  Administered 2022-04-08: 50 meq via INTRAVENOUS
  Filled 2022-04-08: qty 50

## 2022-04-08 MED ORDER — SUCCINYLCHOLINE CHLORIDE 200 MG/10ML IV SOSY
PREFILLED_SYRINGE | INTRAVENOUS | Status: DC | PRN
  Administered 2022-04-08: 160 mg via INTRAVENOUS

## 2022-04-08 MED ORDER — HYDROCORTISONE SOD SUC (PF) 100 MG IJ SOLR: 100.0000 mg | Freq: Three times a day (TID) | INTRAMUSCULAR | Status: DC

## 2022-04-08 MED ORDER — VANCOMYCIN VARIABLE DOSE PER UNSTABLE RENAL FUNCTION (PHARMACIST DOSING): Status: DC

## 2022-04-08 MED ORDER — LACTATED RINGERS IV BOLUS
1000.0000 mL | Freq: Once | INTRAVENOUS | Status: AC
  Administered 2022-04-08: 1000 mL via INTRAVENOUS

## 2022-04-08 MED ORDER — SODIUM BICARBONATE 8.4 % IV SOLN
INTRAVENOUS | Status: AC
  Filled 2022-04-08: qty 100

## 2022-04-08 MED ORDER — SODIUM CHLORIDE 0.9 % IV SOLN
INTRAVENOUS | Status: DC | PRN
  Administered 2022-04-08: 17 mL

## 2022-04-08 MED ORDER — INSULIN ASPART 100 UNIT/ML IJ SOLN: 0.0000 [IU] | INTRAMUSCULAR | Status: DC

## 2022-04-08 MED ORDER — PROPOFOL 10 MG/ML IV BOLUS
INTRAVENOUS | Status: AC
  Filled 2022-04-08: qty 20

## 2022-04-08 MED ORDER — VANCOMYCIN HCL IN DEXTROSE 1-5 GM/200ML-% IV SOLN: 1000.0000 mg | Freq: Once | INTRAVENOUS | Status: DC

## 2022-04-08 MED ORDER — STERILE WATER FOR IRRIGATION IR SOLN
Status: DC | PRN
  Administered 2022-04-08: 1000 mL

## 2022-04-08 MED ORDER — VASOPRESSIN 20 UNIT/ML IV SOLN
INTRAVENOUS | Status: AC
  Filled 2022-04-08: qty 1

## 2022-04-08 MED ORDER — VASOPRESSIN 20 UNITS/100 ML INFUSION FOR SHOCK
INTRAVENOUS | Status: DC | PRN
  Administered 2022-04-08: .03 [IU]/min via INTRAVENOUS

## 2022-04-08 MED ORDER — VASOPRESSIN 20 UNIT/ML IV SOLN
INTRAVENOUS | Status: DC | PRN
  Administered 2022-04-08: 2 [IU] via INTRAVENOUS
  Administered 2022-04-08: 1 [IU] via INTRAVENOUS
  Administered 2022-04-08 (×2): 2 [IU] via INTRAVENOUS

## 2022-04-08 MED ORDER — SODIUM CHLORIDE 0.9 % IV SOLN: INTRAVENOUS | Status: DC | PRN

## 2022-04-08 MED ORDER — NOREPINEPHRINE 4 MG/250ML-% IV SOLN
0.0000 ug/min | INTRAVENOUS | Status: DC
  Administered 2022-04-08: 2 ug/min via INTRAVENOUS

## 2022-04-08 MED ORDER — SODIUM CHLORIDE 0.9 % IV SOLN
2.0000 g | Freq: Once | INTRAVENOUS | Status: DC
  Filled 2022-04-08: qty 12.5

## 2022-04-08 MED ORDER — CALCIUM CHLORIDE 10 % IV SOLN
INTRAVENOUS | Status: DC | PRN
  Administered 2022-04-08: 400 mg via INTRAVENOUS
  Administered 2022-04-08 (×2): 300 mg via INTRAVENOUS

## 2022-04-08 MED ORDER — LIDOCAINE HCL (CARDIAC) PF 100 MG/5ML IV SOSY
PREFILLED_SYRINGE | INTRAVENOUS | Status: DC | PRN
  Administered 2022-04-08: 40 mg via INTRATRACHEAL

## 2022-04-08 MED ORDER — MIDAZOLAM HCL 2 MG/2ML IJ SOLN
INTRAMUSCULAR | Status: AC
  Filled 2022-04-08: qty 2

## 2022-04-08 MED ORDER — SODIUM CHLORIDE 0.9 % IV SOLN
2.0000 g | INTRAVENOUS | Status: DC
  Administered 2022-04-08: 2 g via INTRAVENOUS

## 2022-04-08 MED ORDER — EPINEPHRINE 1 MG/10ML IJ SOSY
PREFILLED_SYRINGE | INTRAMUSCULAR | Status: AC
  Filled 2022-04-08: qty 10

## 2022-04-08 MED ORDER — FENTANYL CITRATE (PF) 250 MCG/5ML IJ SOLN
INTRAMUSCULAR | Status: AC
  Filled 2022-04-08: qty 5

## 2022-04-08 MED ORDER — SODIUM CHLORIDE 0.9 % IR SOLN
Status: DC | PRN
  Administered 2022-04-08: 3000 mL

## 2022-04-08 MED ORDER — VANCOMYCIN HCL 2000 MG/400ML IV SOLN
2000.0000 mg | Freq: Once | INTRAVENOUS | Status: AC
  Administered 2022-04-08: 2000 mg via INTRAVENOUS
  Filled 2022-04-08: qty 400

## 2022-04-08 MED ORDER — PROPOFOL 10 MG/ML IV BOLUS
INTRAVENOUS | Status: DC | PRN
  Administered 2022-04-08: 110 mg via INTRAVENOUS

## 2022-04-08 MED ORDER — LACTATED RINGERS IV SOLN: INTRAVENOUS | Status: DC | PRN

## 2022-04-08 MED ORDER — SODIUM BICARBONATE 8.4 % IV SOLN
INTRAVENOUS | Status: DC | PRN
  Administered 2022-04-08 (×5): 50 meq via INTRAVENOUS

## 2022-04-08 MED ORDER — ACETAMINOPHEN 10 MG/ML IV SOLN
INTRAVENOUS | Status: AC
  Filled 2022-04-08: qty 100

## 2022-04-08 MED ORDER — EPINEPHRINE PF 1 MG/ML IJ SOLN
INTRAMUSCULAR | Status: DC | PRN
  Administered 2022-04-08: 1 mg via INTRAVENOUS
  Administered 2022-04-08: .1 mg via INTRAVENOUS

## 2022-04-08 MED ORDER — MIDAZOLAM HCL 5 MG/5ML IJ SOLN
INTRAMUSCULAR | Status: DC | PRN
  Administered 2022-04-08: 2 mg via INTRAVENOUS

## 2022-04-08 MED ORDER — ACETAMINOPHEN 10 MG/ML IV SOLN
INTRAVENOUS | Status: DC | PRN
  Administered 2022-04-08: 1000 mg via INTRAVENOUS

## 2022-04-08 MED ORDER — SODIUM BICARBONATE 8.4 % IV SOLN
INTRAVENOUS | Status: DC
  Filled 2022-04-08 (×2): qty 1000

## 2022-04-08 MED ORDER — ROCURONIUM BROMIDE 100 MG/10ML IV SOLN
INTRAVENOUS | Status: DC | PRN
  Administered 2022-04-08: 100 mg via INTRAVENOUS

## 2022-04-08 MED ORDER — ALBUMIN HUMAN 5 % IV SOLN: INTRAVENOUS | Status: DC | PRN

## 2022-04-08 MED ORDER — FENTANYL CITRATE PF 50 MCG/ML IJ SOSY
50.0000 ug | PREFILLED_SYRINGE | Freq: Once | INTRAMUSCULAR | Status: AC
  Administered 2022-04-08: 50 ug via INTRAVENOUS
  Filled 2022-04-08: qty 1

## 2022-04-08 MED ORDER — PHENYLEPHRINE 80 MCG/ML (10ML) SYRINGE FOR IV PUSH (FOR BLOOD PRESSURE SUPPORT): 80.0000 ug | PREFILLED_SYRINGE | Freq: Once | INTRAVENOUS | Status: DC | PRN

## 2022-04-08 MED ORDER — METRONIDAZOLE 500 MG/100ML IV SOLN
500.0000 mg | Freq: Once | INTRAVENOUS | Status: AC
  Administered 2022-04-08: 500 mg via INTRAVENOUS
  Filled 2022-04-08: qty 100

## 2022-04-08 MED ORDER — PHENYLEPHRINE HCL (PRESSORS) 10 MG/ML IV SOLN
INTRAVENOUS | Status: DC | PRN
  Administered 2022-04-08: 120 ug via INTRAVENOUS
  Administered 2022-04-08: 80 ug via INTRAVENOUS

## 2022-04-08 SURGICAL SUPPLY — 22 items
BAG URINE DRAIN 2000ML AR STRL (UROLOGICAL SUPPLIES) IMPLANT
BAG URO CATCHER STRL LF (MISCELLANEOUS) ×1 IMPLANT
CATH FOLEY 2WAY SLVR  5CC 16FR (CATHETERS) ×1
CATH FOLEY 2WAY SLVR 5CC 16FR (CATHETERS) IMPLANT
CATH URET 5FR 28IN OPEN ENDED (CATHETERS) ×1 IMPLANT
GLOVE BIOGEL M STRL SZ7.5 (GLOVE) ×1 IMPLANT
GOWN STRL REUS W/ TWL LRG LVL3 (GOWN DISPOSABLE) ×1 IMPLANT
GOWN STRL REUS W/TWL LRG LVL3 (GOWN DISPOSABLE) ×1
GOWN STRL REUS W/TWL XL LVL3 (GOWN DISPOSABLE) ×1 IMPLANT
GUIDEWIRE ANG ZIPWIRE 038X150 (WIRE) ×1 IMPLANT
GUIDEWIRE STR DUAL SENSOR (WIRE) IMPLANT
KIT TURNOVER KIT B (KITS) ×1 IMPLANT
MANIFOLD NEPTUNE II (INSTRUMENTS) ×4 IMPLANT
MAT PREVALON FULL STRYKER (MISCELLANEOUS) IMPLANT
NS IRRIG 1000ML POUR BTL (IV SOLUTION) ×1 IMPLANT
PACK CYSTO (CUSTOM PROCEDURE TRAY) ×1 IMPLANT
STENT URET 6FRX24 CONTOUR (STENTS) IMPLANT
STENT URET 6FRX26 CONTOUR (STENTS) IMPLANT
SYPHON OMNI JUG (MISCELLANEOUS) ×1 IMPLANT
TOWEL GREEN STERILE FF (TOWEL DISPOSABLE) ×1 IMPLANT
TUBE CONNECTING 12X1/4 (SUCTIONS) ×1 IMPLANT
WATER STERILE IRR 3000ML UROMA (IV SOLUTION) ×1 IMPLANT

## 2022-04-09 ENCOUNTER — Encounter (HOSPITAL_COMMUNITY): Payer: Self-pay | Admitting: Urology

## 2022-04-09 LAB — BLOOD CULTURE ID PANEL (REFLEXED) - BCID2

## 2022-04-10 LAB — URINE CULTURE: Culture: >=100000 — AB

## 2022-04-11 LAB — URINE CULTURE: Culture: >=100000 — AB

## 2022-04-11 LAB — CULTURE, BLOOD (ROUTINE X 2)

## 2022-04-25 MED FILL — Medication: Qty: 1 | Status: AC

## 2022-05-06 NOTE — ED Notes (Signed)
Verbal by MD to go to 45mcg levo

## 2022-05-06 NOTE — ED Notes (Signed)
MD just pushed another dose of 80mg  NEO

## 2022-05-06 NOTE — ED Notes (Signed)
MD pushed 80mg  NEO

## 2022-05-06 NOTE — ED Notes (Signed)
Patient transported to X-ray 

## 2022-05-06 NOTE — ED Notes (Signed)
Pt with increased work og breathing placed on 3L [place] MD asked to come to Bedside pt tachy 140's. MD called RT placing pt on bipap

## 2022-05-06 NOTE — ED Notes (Signed)
MD gave 80mg  Phenylphrine

## 2022-05-06 NOTE — ED Provider Notes (Signed)
Physical Exam  BP (!) 92/50   Pulse (!) 136   Temp 98.3 F (36.8 C) (Oral)   Resp (!) 32   Ht 5\' 11"  (1.803 m)   Wt (!) 177.4 kg   SpO2 96%   BMI 54.53 kg/m   Physical Exam Constitutional:      General: He is in acute distress.     Appearance: Normal appearance. He is ill-appearing and diaphoretic.  HENT:     Head: Normocephalic and atraumatic.     Nose: No congestion or rhinorrhea.  Eyes:     General:        Right eye: No discharge.        Left eye: No discharge.     Extraocular Movements: Extraocular movements intact.     Pupils: Pupils are equal, round, and reactive to light.  Cardiovascular:     Rate and Rhythm: Regular rhythm. Tachycardia present.     Heart sounds: No murmur heard. Pulmonary:     Effort: Respiratory distress present.     Breath sounds: No rales.  Abdominal:     General: There is no distension.     Tenderness: There is no abdominal tenderness.  Musculoskeletal:        General: Normal range of motion.     Cervical back: Normal range of motion.  Skin:    General: Skin is warm.  Neurological:     General: No focal deficit present.     Mental Status: He is alert.     Procedures  .Critical Care  Performed by: Teressa Lower, MD Authorized by: Teressa Lower, MD   Critical care provider statement:    Critical care time (minutes):  80   Critical care was time spent personally by me on the following activities:  Development of treatment plan with patient or surrogate, discussions with consultants, evaluation of patient's response to treatment, examination of patient, ordering and review of laboratory studies, ordering and review of radiographic studies, ordering and performing treatments and interventions, pulse oximetry, re-evaluation of patient's condition and review of old charts ARTERIAL LINE  Date/Time: 2022/04/26 6:52 PM  Performed by: Teressa Lower, MD Authorized by: Teressa Lower, MD   Consent:    Consent obtained:  Emergent  situation Indications:    Indications: hemodynamic monitoring and multiple ABGs   Pre-procedure details:    Skin preparation:  Chlorhexidine   Preparation: Patient was prepped and draped in sterile fashion   Sedation:    Sedation type:  None Anesthesia:    Anesthesia method:  None Procedure details:    Location:  R radial   Placement technique:  Ultrasound guided and Seldinger   Number of attempts:  1   Transducer: waveform confirmed   Post-procedure details:    Post-procedure:  Sterile dressing applied and sutured   CMS:  Normal   Procedure completion:  Tolerated well, no immediate complications   ED Course / MDM   Clinical Course as of 04-26-2022 1851  Fri 04/26/22  1334 WBC(!): 26.8 [HN]  1334 DG Chest 2 View FINDINGS: Low lung volumes accentuate the pulmonary vasculature and cardiomediastinal silhouette. No focal airspace opacity. No pleural effusion or pneumothorax. Visualized bones and upper abdomen are unremarkable.  IMPRESSION: Low lung volumes without evidence of acute cardiopulmonary disease.   [HN]  1334 BP(!): 80/58 [HN]  1422 After 1L of IVF now on 2nd L, SBP >875 systolic, HR in 643P [HN]  1422 PIV placed by [place] [HN]  1422 BS echo doesn't show  signs of RV failure that would suggest large PE. No pericardial effusion noted. Normal contractility.  [HN]  1423 Creatinine(!): 4.48 Large AKI [HN]  1423 Lactic Acid, Venous(!!): 7.5 Receiving 2nd L of IVF on pressure bag [HN]  1423 Resp panel by RT-PCR (RSV, Flu A&B, Covid) Anterior Nasal Swab neg [HN]  1423 Troponin I (High Sensitivity)(!): 85 [HN]  1423 AST(!): 91 [HN]  1423 ALT(!): 62 [HN]  1423 Total Bilirubin(!): 1.4 Mild transaminitis [HN]  1424 DG Chest 2 View FINDINGS: Low lung volumes accentuate the pulmonary vasculature and cardiomediastinal silhouette. No focal airspace opacity. No pleural effusion or pneumothorax. Visualized bones and upper abdomen are unremarkable.  IMPRESSION: Low lung  volumes without evidence of acute cardiopulmonary disease.   [HN]  1457 BP in 90s, receiving 3rd L IVF [HN]  1510 Urinalysis, w/ Reflex to Culture (Infection Suspected) -Urine, Clean Catch(!) +UTI, consider possible urosepsis/septic stone [HN]  1511 Given acute renal failure, will perform CT chest and abdomen/pelvis without contrast initially [HN]  1515 Resp panel by RT-PCR (RSV, Flu A&B, Covid) Anterior Nasal Swab Neg [HN]  1517 Urine culture in process [HN]  1612 Went to evaluate patient he was significantly more short of breath and hypoxic to 80% with respirations in the 30s to 40s and heart rate increased from 120s to 140s-160s. Considered volume overload/pulmonary edema from fluid bolus for hypotension though patient is 177 kg and received 3L IVF (17 cc/kg bolus) but reevaluated lungs with BS echo and there is no pulmonary edema, lung sounds clear.  Immediately placed patient on nonrebreather which increased oxygen to 98% and called respiratory therapy who placed patient on bipap for WOB.  Obtained an EKG which demonstrates sinus tachycardia in the 160s.  Patient complaining of worsening back pain and will treat with fentanyl IV.  [HN]  1628 pH, Ven(!!): 7.070 Starting bicarb amp and gtt. Paged critical care. [HN]  1628 Patient is signed out to the oncoming ED physician Dr. Matilde Sprang who is made aware of his presentation, history, exam, workup, and plan. Plan is to start bicarb drip, obtain CT scans STAT, and admit to ICU.  [HN]    Clinical Course User Index [HN] Audley Hose, MD   Medical Decision Making Amount and/or Complexity of Data Reviewed Labs: ordered. Decision-making details documented in ED Course. Radiology: ordered. Decision-making details documented in ED Course.  Risk Prescription drug management. Decision regarding hospitalization.   Patient received in handoff.  Septic shock with hypoxia requiring BiPAP.  Source of sepsis unknown at time of signout.  Patient found  to be severely acidotic with pH 7.07 and rising lactic acid.  Amps of bicarb given and patient started on bicarb drip.  CT chest abdomen pelvis showing an obstructing 2 mm stone on the right with hydronephrosis and patient presentation does appear consistent with septic stone.  Patient pressor requirements worsening while in the emergency department and he required Levophed, vasopressin and multiple pushes of phenylephrine.  Urology was consulted who will take the patient emergently for stent placement.  Spoke with the ICU who will admit the patient after operating room.  Patient transferred the operating room.       Teressa Lower, MD May 05, 2022 [phone]

## 2022-05-06 NOTE — ED Notes (Signed)
CCMD, Dr. Tamala Julian verbal order to put bicarb on the pump at 175ml/hr.

## 2022-05-06 NOTE — ED Notes (Signed)
Pt shaking and states he is cold applied warm blankets

## 2022-05-06 NOTE — Progress Notes (Signed)
Pharmacy Antibiotic Note  Nathaniel Martin is a 52 y.o. male admitted on 2022/05/03 with sepsis, source unknown.  Pharmacy has been consulted for vancomycin and cefepime dosing. WBCs elevated at 26.8. Scr 4.48 elevated from baseline of 1.11. Expect that true CrCl <30 based on trend.   Plan: Vancomycin 20000 mg IV x 1, then dose based on random level Cefepime 2g IV q24h  Monitor s/sx improvement, C&S, renal function, LOT   Height: 5\' 11"  (180.3 cm) Weight: (!) 177.4 kg (391 lb) IBW/kg (Calculated) : 75.3  Temp (24hrs), Avg:97.6 F (36.4 C), Min:97.4 F (36.3 C), Max:97.8 F (36.6 C)  Recent Labs  Lab 04/07/22 1021 03-May-2022 1229  WBC 6.5 26.8*  CREATININE 1.11 4.48*    Estimated Creatinine Clearance: 32.4 mL/min (A) (by C-G formula based on SCr of 4.48 mg/dL (H)).    No Known Allergies  Antimicrobials this admission: 2/2 metronidazole x 1 2/2 vancomycin >>  2/2 cefepime >>   Dose adjustments this admission:   Microbiology results: 2/2 BCx: sent 2/2 RVP: neg  Thank you for allowing pharmacy to be a part of this patient's care.  Eliseo Gum, PharmD PGY1 Pharmacy Resident   05/03/2022  2:15 PM

## 2022-05-06 NOTE — Progress Notes (Signed)
Acute event, CPR documentation  Interval events: Patient moved to the OR for right ureteral stent placement and retrograde pyelography around 1900.  He has septic shock with profound metabolic acidosis, lactic acidosis, acute renal failure.  Progressive pressor needs, bicarb needs leading into the case.  Intubation was apparently uncomplicated.  He had persistent hypotension requiring epi boluses, PEA with short periods of chest compressions.  Procedure was completed but he remained hemodynamically unstable.  He received epinephrine and chest compressions just prior to transfer back to the ICU 2M05.  CPR continued.  Norepinephrine uptitrated to a size 50 with vasopressin 0.03.  Multiple rounds of epinephrine, bicarb x 7 and bicarbonate drip initiated.  Despite all interventions.  ROSC could never be maintained.  Other diagnoses besides septic shock and profound vasoplegia were considered.  Good bilateral breath sounds heard so unlikely that there was pneumothorax upon which we could intervene.  Similarly felt unlikely that he would benefit from lytics.  CPR lasted approximately 30 minutes plus the interventions that were made in the OR prior to his arrival in 2M05.  After an extended period of CPR code was called at 2100 and TOD 2100.   Please refer to the formal code documentation as per IMTS code team.  Family returned to the hospital and are coming to bedside.  Events and status to be relayed now by Mikki Harbor. We will get them to bedside when able.    Baltazar Apo, MD, PhD 18-Apr-2022, 9:14 PM [facility] [phone] or if no answer before 7:00PM call [phone] For any issues after 7:00PM please call eLink [phone]

## 2022-05-06 NOTE — Anesthesia Procedure Notes (Signed)
Procedure Name: Intubation Date/Time: 05/08/2022 7:16 PM  Performed by: Clovis Cao, CRNAPre-anesthesia Checklist: Patient identified, Emergency Drugs available, Suction available and Patient being monitored Patient Re-evaluated:Patient Re-evaluated prior to induction Oxygen Delivery Method: Circle system utilized Preoxygenation: Pre-oxygenation with 100% oxygen Induction Type: IV induction, Rapid sequence and Cricoid Pressure applied Laryngoscope Size: Glidescope and 4 Grade View: Grade I Tube type: Oral Tube size: 7.5 mm Number of attempts: 1 Airway Equipment and Method: Stylet and Video-laryngoscopy Placement Confirmation: ETT inserted through vocal cords under direct vision, positive ETCO2 and breath sounds checked- equal and bilateral Tube secured with: Tape Dental Injury: Teeth and Oropharynx as per pre-operative assessment

## 2022-05-06 NOTE — Sepsis Progress Note (Signed)
2/2 Sepsis protocol monitored by eLink

## 2022-05-06 NOTE — Anesthesia Preprocedure Evaluation (Addendum)
Anesthesia Evaluation  Patient identified by MRN, date of birth, ID band Patient awake    Reviewed: Allergy & Precautions, NPO status , Patient's Chart, lab work & pertinent test results  Airway Mallampati: III  TM Distance: >3 FB Neck ROM: Full    Dental  (+) Teeth Intact, Poor Dentition   Pulmonary neg pulmonary ROS   breath sounds clear to auscultation       Cardiovascular hypertension, Pt. on medications  Rhythm:Regular Rate:Tachycardia     Neuro/Psych negative neurological ROS  negative psych ROS   GI/Hepatic negative GI ROS, Neg liver ROS,,,  Endo/Other  diabetes, Type 2, Oral Hypoglycemic Agents    Renal/GU      Musculoskeletal negative musculoskeletal ROS (+)    Abdominal   Peds  Hematology negative hematology ROS (+)   Anesthesia Other Findings - Resp. Distress on BiPAP  Reproductive/Obstetrics                             Anesthesia Physical Anesthesia Plan  ASA: 4 and emergent  Anesthesia Plan: General   Post-op Pain Management: Ofirmev IV (intra-op)*   Induction: Intravenous, Rapid sequence and Cricoid pressure planned  PONV Risk Score and Plan: 3 and Ondansetron, Dexamethasone and Midazolam  Airway Management Planned: Oral ETT and Video Laryngoscope Planned  Additional Equipment: None  Intra-op Plan:   Post-operative Plan: Post-operative intubation/ventilation  Informed Consent: I have reviewed the patients History and Physical, chart, labs and discussed the procedure including the risks, benefits and alternatives for the proposed anesthesia with the patient or authorized representative who has indicated his/her understanding and acceptance.     Dental advisory given  Plan Discussed with: CRNA  Anesthesia Plan Comments:        Anesthesia Quick Evaluation

## 2022-05-06 NOTE — H&P (Addendum)
NAME:  Nathaniel Martin, MRN:  [id number], DOB:  1970/03/10, LOS: 0 ADMISSION DATE:  05-May-2022, CONSULTATION DATE:  2/2 REFERRING MD: Dr. Matilde Sprang, CHIEF COMPLAINT: N/V/D, ABD Pain   History of Present Illness:  52 y/o M who presented to [facility] ER on 2/2 with reports of 48 hours of nausea, vomiting, diarrhea, abdominal pain, dizziness and blurred vision.    He was short of breath on presentation as well as tachycardic.  The patient was placed on [place] O2 and subsequently bipap. He developed shaking chills in the ER, diaphoresis, tachypnea.  VBG assessed showed ph 7.07 / 59.  Initial labs - Na 135, K 4.6, CO2 18, glucose 117, BUN 29, Cr 4.48, AG 18, AST 91 / ALT 62, lactic acid 7.5 > 8, WBC 26.8, Hgb 14, and platelets 104. INR 1.5. COVID / Influenza / RSV negative. UA with large leukocytes, 100 protein,  many bacteria, >50 WBC.  CT chest abdomen pelvis demonstrated obstructing 2 mm distal right ureteral calculus, mild right-sided obstructive uropathy and perinephric fat stranding.  Other sub-3 mm nonobstructing bilateral renal calculi as above.  Scattered dependent atelectasis bilaterally with trace right pleural effusion.  Patient was treated with LR fluid bolus, empiric broad-spectrum antibiotics and vasopressors.  He was given 50 mEq of sodium bicarbonate by injection and then infusion started thereafter.  Urology was consulted and patient was posted for urgent OR time for ureteral stent.  PCCM consulted for admission.  Pertinent  Medical History  HTN HLD DM II Obesity / BMI 54.53  Significant Hospital Events: Including procedures, antibiotic start and stop dates in addition to other pertinent events   2/2 Admit with obstructive uropathy and septic shock  Interim History / Subjective:  As above Patient complains of right low back pain  Objective   Blood pressure (!) 83/38, pulse (!) 145, temperature 98.3 F (36.8 C), temperature source Oral, resp. rate (!) 27, height 5\' 11"  (1.803 m), weight  (!) 177.4 kg, SpO2 100 %.    Vent Mode: BIPAP;PSV FiO2 (%):  [100 %] 100 % PEEP:  [5 cmH20] 5 cmH20 Pressure Support:  [5 cmH20] 5 cmH20   Intake/Output Summary (Last 24 hours) at May 05, 2022 1738 Last data filed at May 05, 2022 1738 Gross per 24 hour  Intake 3612.37 ml  Output --  Net 3612.37 ml   Filed Weights   2022-05-05 1328  Weight: (!) 177.4 kg    Examination: General: Critically ill-appearing adult male sitting up on ER stretcher on BiPAP HENT: BiPAP mask in place, facial flushing noted, anicteric, speech clear Lungs: Tachypneic, BiPAP mask in place,Vt 800-1L on BiPAP, lungs bilaterally clear with good air entry Cardiovascular: S1-S2 RRR, sinus tach on monitor, no MRG Abdomen: Obese, soft BS x 4 active Extremities: Warm/dry Neuro: AAOx4, speech clear, MAE, nonfocal   Resolved Hospital Problem list     Assessment & Plan:   Obstructive Uropathy with Septic Shock -admit to ICU  -appreciate Urology  -vasopressors as needed for MAP >65  -ER MD discussed central access with anesthesia > appreciate placement of advanced vascular access  -continue empiric broad spectrum abx -urine culture, blood cultures pending  -stress dose steroids -NPO  Anticipate Mechanical Ventilation Post Procedure  Obesity Hypoventilation Syndrome High Risk Cardiac Arrest with Induction due to Metabolic Derangements -continue BiPAP for transport  -consider additional bicarbonate push prior to induction   AKI  Acute AG Metabolic Acidosis -Trend BMP / urinary output -Replace electrolytes as indicated -Avoid nephrotoxic agents, ensure adequate renal perfusion -hold home  mobic, antihypertensives  DM II  -SSI, moderate dose  -monitor glucose with steroids  -hold home metformin  Hx HTN, HLD  -hold home agents with AKI, hypotension  Best Practice (right click and "Reselect all SmartList Selections" daily)  Diet/type: NPO DVT prophylaxis: not indicated GI prophylaxis: N/A Lines: N/A Foley:   N/A Code Status:  full code Last date of multidisciplinary goals of care discussion: full scope care confirmed. Patient's wife, Nathaniel Martin - [phone].  Patient and wife updated at bedside 2/2 pre procedure on plan of care.   Labs   CBC: Recent Labs  Lab 04/07/22 1021 04-23-2022 1229 2022-04-23 1615  WBC 6.5 26.8*  --   NEUTROABS 5.8  --   --   HGB 14.4 14.0 12.9*  HCT 42.8 42.8 38.0*  MCV 92 96.0  --   PLT 201 104*  --     Basic Metabolic Panel: Recent Labs  Lab 04/07/22 1021 04-23-22 1229 04/23/2022 1615  NA 141 135 135  K 3.9 4.6 4.4  CL 101 99  --   CO2 24 18*  --   GLUCOSE 110* 117*  --   BUN 16 29*  --   CREATININE 1.11 4.48*  --   CALCIUM 10.0 9.1  --    GFR: Estimated Creatinine Clearance: 32.4 mL/min (A) (by C-G formula based on SCr of 4.48 mg/dL (H)). Recent Labs  Lab 04/07/22 1021 Apr 23, 2022 1228 04/23/22 1229 Apr 23, 2022 1541  WBC 6.5  --  26.8*  --   LATICACIDVEN  --  7.5*  --  8.0*    Liver Function Tests: Recent Labs  Lab 04/07/22 1021 April 23, 2022 1229  AST 77* 91*  ALT 71* 62*  ALKPHOS 101 76  BILITOT 0.4 1.4*  PROT 7.6 7.5  ALBUMIN 4.3 3.2*   Recent Labs  Lab April 23, 2022 1229  LIPASE 25   No results for input(s): "AMMONIA" in the last 168 hours.  ABG    Component Value Date/Time   HCO3 17.2 (L) Apr 23, 2022 1615   TCO2 19 (L) 2022-04-23 1615   ACIDBASEDEF 13.0 (H) April 23, 2022 1615   O2SAT 52 Apr 23, 2022 1615     Coagulation Profile: Recent Labs  Lab 04/23/22 1229  INR 1.5*    Cardiac Enzymes: No results for input(s): "CKTOTAL", "CKMB", "CKMBINDEX", "TROPONINI" in the last 168 hours.  HbA1C: HB A1C (BAYER DCA - WAIVED)  Date/Time Value Ref Range Status  04/07/2022 10:20 AM 6.3 (H) 4.8 - 5.6 % Final    Comment:             Prediabetes: 5.7 - 6.4          Diabetes: >6.4          Glycemic control for adults with diabetes: <7.0   01/03/2022 08:17 AM 7.1 (H) 4.8 - 5.6 % Final    Comment:             Prediabetes: 5.7 - 6.4           Diabetes: >6.4          Glycemic control for adults with diabetes: <7.0     CBG: No results for input(s): "GLUCAP" in the last 168 hours.  Review of Systems:   Gen: Denies fever, chills, weight change, fatigue, night sweats HEENT: Denies blurred vision, double vision, hearing loss, tinnitus, sinus congestion, rhinorrhea, sore throat, neck stiffness, dysphagia PULM: Denies shortness of breath, cough, sputum production, hemoptysis, wheezing CV: Denies chest pain, edema, orthopnea, paroxysmal nocturnal dyspnea, palpitations GI: Denies abdominal pain,  nausea, vomiting, diarrhea, hematochezia, melena, constipation, change in bowel habits, right flank pain GU: Denies dysuria, hematuria, polyuria, oliguria, urethral discharge Endocrine: Denies hot or cold intolerance, polyuria, polyphagia or appetite change Derm: Denies rash, dry skin, scaling or peeling skin change Heme: Denies easy bruising, bleeding, bleeding gums Neuro: Denies headache, numbness, weakness, slurred speech, loss of memory or consciousness  Past Medical History:  He,  has a past medical history of Hyperlipidemia and Hypertension.   Surgical History:   Past Surgical History:  Procedure Laterality Date   KIDNEY STONE SURGERY       Social History:   reports that he has never smoked. He has never used smokeless tobacco. He reports that he does not drink alcohol and does not use drugs.   Family History:  His family history includes Cancer in his father; Diabetes in his brother, brother, father, and mother; Heart attack in his paternal grandfather and paternal grandmother; Heart disease in his paternal grandfather and paternal grandmother.   Allergies No Known Allergies   Home Medications  Prior to Admission medications   Medication Sig Start Date End Date Taking? Authorizing Provider  acetaminophen (TYLENOL) 500 MG tablet Take 1,000 mg by mouth every 6 (six) hours as needed.   Yes [provider]   amLODipine (NORVASC) 5 MG tablet TAKE 1 TABLET BY MOUTH EVERY DAY 03/23/22  Yes Dettinger, Fransisca Kaufmann, MD  hydrochlorothiazide (HYDRODIURIL) 25 MG tablet Take 1 tablet (25 mg total) by mouth daily. 03/29/21  Yes Dettinger, Fransisca Kaufmann, MD  losartan (COZAAR) 25 MG tablet Take 1 tablet (25 mg total) by mouth daily. 03/29/21  Yes Dettinger, Fransisca Kaufmann, MD  meloxicam (MOBIC) 15 MG tablet Take 1 tablet (15 mg total) by mouth daily. 03/29/21  Yes Dettinger, Fransisca Kaufmann, MD  metFORMIN (GLUCOPHAGE) 500 MG tablet Take 1 tablet (500 mg total) by mouth 2 (two) times daily with a meal. 01/03/22  Yes Dettinger, Fransisca Kaufmann, MD  ondansetron (ZOFRAN) 4 MG tablet Take 1 tablet (4 mg total) by mouth every 8 (eight) hours as needed for nausea or vomiting. 04/07/22  Yes Dettinger, Fransisca Kaufmann, MD  pravastatin (PRAVACHOL) 40 MG tablet Take 1 tablet (40 mg total) by mouth daily. 03/29/21  Yes Dettinger, Fransisca Kaufmann, MD  Semaglutide,0.25 or 0.5MG /DOS, 2 MG/3ML SOPN Inject 0.25 mg into the skin once a week. 01/03/22  Yes Dettinger, Fransisca Kaufmann, MD     Critical care time: 75 minutes     Noe Gens, MSN, APRN, NP-C, AGACNP-BC [facility] 04-14-2022, 5:38 PM   Please see [email or web address] for pager details.   From 7A-7P if no response, please call [phone] After hours, please call ELink [phone]

## 2022-05-06 NOTE — ED Provider Notes (Signed)
[place] [facility] [place] Provider Note   CSN: [id number] Arrival date & time: 04/15/2022  1144     History  Chief Complaint  Patient presents with   Nausea    Nathaniel Martin is a 52 y.o. male with T2DM, HTN, HLD presents with nausea chest pain back pain hypotension.   Per chart review patient was seen by his PCP yesterday for nausea and vomiting.  Reported that he had some upper abdominal pain but had a bowel movement and felt better.  Denies fevers but endorses chills.  Thought likely gastroenteritis and was sent home with Zofran.  Had vomiting in the office.  Presents emergency department today with left-sided chest pain back pain and shortness of breath that began overnight.  On arrival patient is noted to be tachycardic into the 110s to 120s bpm and hypotensive into the 70s systolic.  He states he cannot get comfortable.  He denies any numbness tingling anywhere, urinary symptoms.  States he is continue to be a nauseous and vomiting with diarrhea nonbloody.  Denies any history of similar.  Patient is tachycardic hypotensive and noted to be tachypneic, short of breath.  Limited by acuity of situation.  HPI     Home Medications Prior to Admission medications   Medication Sig Start Date End Date Taking? Authorizing Provider  acetaminophen (TYLENOL) 500 MG tablet Take 1,000 mg by mouth every 6 (six) hours as needed.   Yes [provider]  amLODipine (NORVASC) 5 MG tablet TAKE 1 TABLET BY MOUTH EVERY DAY 03/23/22  Yes Dettinger, Elige RadonJoshua A, MD  hydrochlorothiazide (HYDRODIURIL) 25 MG tablet Take 1 tablet (25 mg total) by mouth daily. 03/29/21  Yes Dettinger, Elige RadonJoshua A, MD  losartan (COZAAR) 25 MG tablet Take 1 tablet (25 mg total) by mouth daily. 03/29/21  Yes Dettinger, Elige RadonJoshua A, MD  meloxicam (MOBIC) 15 MG tablet Take 1 tablet (15 mg total) by mouth daily. 03/29/21  Yes Dettinger, Elige RadonJoshua A, MD  metFORMIN (GLUCOPHAGE) 500 MG tablet Take 1 tablet  (500 mg total) by mouth 2 (two) times daily with a meal. 01/03/22  Yes Dettinger, Elige RadonJoshua A, MD  ondansetron (ZOFRAN) 4 MG tablet Take 1 tablet (4 mg total) by mouth every 8 (eight) hours as needed for nausea or vomiting. 04/07/22  Yes Dettinger, Elige RadonJoshua A, MD  pravastatin (PRAVACHOL) 40 MG tablet Take 1 tablet (40 mg total) by mouth daily. 03/29/21  Yes Dettinger, Elige RadonJoshua A, MD  Semaglutide,0.25 or 0.5MG /DOS, 2 MG/3ML SOPN Inject 0.25 mg into the skin once a week. 01/03/22  Yes Dettinger, Elige RadonJoshua A, MD      Allergies    Patient has no known allergies.    Review of Systems   Review of Systems Review of systems Positive for chills.  A 10 point review of systems was performed and is negative unless otherwise reported in HPI.  Physical Exam Updated Vital Signs BP (!) 84/50   Pulse (!) 134   Temp 98.3 F (36.8 C) (Oral)   Resp (!) 40   Ht 5\' 11"  (1.803 m)   Wt (!) 177.4 kg   SpO2 99%   BMI 54.53 kg/m  Physical Exam General: Normal appearing male, lying in bed.  HEENT: PERRLA, Sclera anicteric, MMM, trachea midline.  Cardiology: RRR, no murmurs/rubs/gallops. BL radial and DP pulses equal bilaterally.  Resp: Normal respiratory rate and effort. CTAB, no wheezes, rhonchi, crackles.  Abd: Soft, non-tender, non-distended. No rebound tenderness or guarding.  GU: Deferred. MSK: No peripheral  edema or signs of trauma. Extremities without deformity or TTP. No cyanosis or clubbing. Skin: warm, dry. No rashes or lesions. Back: No CVA tenderness Neuro: A&Ox4, CNs II-XII grossly intact. MAEs. Sensation grossly intact.  Psych: Normal mood and affect.   ED Results / Procedures / Treatments   Labs (all labs ordered are listed, but only abnormal results are displayed) Labs Reviewed  COMPREHENSIVE METABOLIC PANEL - Abnormal; Notable for the following components:      Result Value   CO2 18 (*)    Glucose, Bld 117 (*)    BUN 29 (*)    Creatinine, Ser 4.48 (*)    Albumin 3.2 (*)    AST 91 (*)    ALT  62 (*)    Total Bilirubin 1.4 (*)    GFR, Estimated 15 (*)    Anion gap 18 (*)    All other components within normal limits  CBC - Abnormal; Notable for the following components:   WBC 26.8 (*)    Platelets 104 (*)    All other components within normal limits  LACTIC ACID, PLASMA - Abnormal; Notable for the following components:   Lactic Acid, Venous 7.5 (*)    All other components within normal limits  LACTIC ACID, PLASMA - Abnormal; Notable for the following components:   Lactic Acid, Venous 8.0 (*)    All other components within normal limits  PROTIME-INR - Abnormal; Notable for the following components:   Prothrombin Time 18.0 (*)    INR 1.5 (*)    All other components within normal limits  APTT - Abnormal; Notable for the following components:   aPTT 38 (*)    All other components within normal limits  URINALYSIS, W/ REFLEX TO CULTURE (INFECTION SUSPECTED) - Abnormal; Notable for the following components:   Color, Urine AMBER (*)    APPearance CLOUDY (*)    Hgb urine dipstick LARGE (*)    Protein, ur 100 (*)    Leukocytes,Ua LARGE (*)    Bacteria, UA MANY (*)    All other components within normal limits  I-STAT VENOUS BLOOD GAS, ED - Abnormal; Notable for the following components:   pH, Ven 7.070 (*)    Bicarbonate 17.2 (*)    TCO2 19 (*)    Acid-base deficit 13.0 (*)    Calcium, Ion 1.05 (*)    HCT 38.0 (*)    Hemoglobin 12.9 (*)    All other components within normal limits  TROPONIN I (HIGH SENSITIVITY) - Abnormal; Notable for the following components:   Troponin I (High Sensitivity) 85 (*)    All other components within normal limits  TROPONIN I (HIGH SENSITIVITY) - Abnormal; Notable for the following components:   Troponin I (High Sensitivity) 61 (*)    All other components within normal limits  RESP PANEL BY RT-PCR (RSV, FLU A&B, COVID)  [id number]  URINE CULTURE  CULTURE, BLOOD (ROUTINE X 2)  CULTURE, BLOOD (ROUTINE X 2)  LIPASE, BLOOD  LACTIC ACID, PLASMA   LACTIC ACID, PLASMA    EKG EKG Interpretation  Date/Time:  Friday 2022/04/22 15:49:31 EST Ventricular Rate:  146 PR Interval:  169 QRS Duration: 243 QT Interval:  354 QTC Calculation: 484 R Axis:   27 Text Interpretation: Baseline artifact makes interpretation difficult Sinus tachycardia Confirmed by Vivi Barrack [phone]) on 2022-04-22 5:50:58 PM  Radiology CT CHEST ABDOMEN PELVIS WO CONTRAST  Result Date: 22-Apr-2022 CLINICAL DATA:  Sepsis, abdominal pain, nausea/vomiting/diarrhea, dizziness EXAM: CT CHEST, ABDOMEN AND  PELVIS WITHOUT CONTRAST TECHNIQUE: Multidetector CT imaging of the chest, abdomen and pelvis was performed following the standard protocol without IV contrast. RADIATION DOSE REDUCTION: This exam was performed according to the departmental dose-optimization program which includes automated exposure control, adjustment of the mA and/or kV according to patient size and/or use of iterative reconstruction technique. COMPARISON:  04/10/22, 05/17/2021 FINDINGS: CT CHEST FINDINGS Cardiovascular: Unenhanced imaging of the heart and great vessels unremarkable. No pericardial effusion. Normal caliber of the thoracic aorta. Atherosclerosis of the aortic arch and coronary vasculature. Mediastinum/Nodes: No enlarged mediastinal, hilar, or axillary lymph nodes. Thyroid gland, trachea, and esophagus demonstrate no significant findings. Lungs/Pleura: Evaluation of the lungs is limited by respiratory motion artifact. Scattered areas of atelectasis are seen within the dependent lungs. No acute airspace disease. There is a trace right pleural effusion. No pneumothorax. Central airways are patent. Musculoskeletal: No acute or destructive bony lesions. Reconstructed images demonstrate no additional findings. CT ABDOMEN PELVIS FINDINGS Hepatobiliary: Unremarkable unenhanced appearance of the liver and gallbladder. Pancreas: Unremarkable unenhanced appearance. Spleen: Unremarkable unenhanced  appearance. Adrenals/Urinary Tract: There is an obstructing 2 mm distal right ureteral calculus reference image 118/3. There is mild right-sided obstructive uropathy, with right renal edema, right perinephric fat stranding, and mild right hydronephrosis and hydroureter. Punctate 2 mm nonobstructing right renal calculus is also noted. There is a punctate 3 mm nonobstructing calculus lower pole left kidney. Otherwise the left kidney is unremarkable. The adrenals and bladder are grossly normal. Stomach/Bowel: No bowel obstruction or ileus. Normal appendix right lower quadrant. Scattered diverticulosis of the distal colon without diverticulitis. Vascular/Lymphatic: No significant vascular findings on this unenhanced exam. No pathologic adenopathy. Reproductive: Prostate is unremarkable. Other: No free intraperitoneal fluid or free gas. No abdominal wall hernia. Musculoskeletal: No acute or destructive bony lesions. Reconstructed images demonstrate no additional findings. IMPRESSION: 1. Obstructing 2 mm distal right ureteral calculus, with mild right-sided obstructive uropathy and right perinephric fat stranding as above. 2. Other sub 3 mm nonobstructing bilateral renal calculi as above. 3. Scattered dependent atelectasis within the bilateral lungs. No acute airspace disease. 4. Trace right pleural effusion. 5. Sigmoid diverticulosis without diverticulitis. 6.  Aortic Atherosclerosis (ICD10-I70.0). Electronically Signed   By: Randa Ngo M.D.   On: 10-Apr-2022 17:02   DG Chest 2 View  Result Date: Apr 10, 2022 CLINICAL DATA:  Shortness of breath. EXAM: CHEST - 2 VIEW COMPARISON:  None Available. FINDINGS: Low lung volumes accentuate the pulmonary vasculature and cardiomediastinal silhouette. No focal airspace opacity. No pleural effusion or pneumothorax. Visualized bones and upper abdomen are unremarkable. IMPRESSION: Low lung volumes without evidence of acute cardiopulmonary disease. Electronically Signed   By:  Emmit Alexanders M.D.   On: April 10, 2022 12:54    Procedures .Critical Care  Performed by: Audley Hose, MD Authorized by: Audley Hose, MD   Critical care provider statement:    Critical care time (minutes):  90   Critical care was necessary to treat or prevent imminent or life-threatening deterioration of the following conditions:  Sepsis, shock, renal failure, dehydration and metabolic crisis   Critical care was time spent personally by me on the following activities:  Development of treatment plan with patient or surrogate, discussions with consultants, evaluation of patient's response to treatment, examination of patient, ordering and review of laboratory studies, ordering and review of radiographic studies, ordering and performing treatments and interventions, pulse oximetry, re-evaluation of patient's condition, review of old charts and obtaining history from patient or surrogate Ultrasound ED Peripheral IV (Provider)  Date/Time:  Apr 16, 2022 2:21 PM  Performed by: Loetta Rough, MD Authorized by: Loetta Rough, MD   Procedure details:    Indications: hypotension, multiple failed IV attempts and poor IV access     Skin Prep: chlorhexidine gluconate     Location:  Right AC   Angiocath:  20 G   Bedside Ultrasound Guided: Yes     Images: not archived     Patient tolerated procedure without complications: Yes     Dressing applied: Yes        EMERGENCY DEPARTMENT [place] CARDIAC EXAM "Study: Limited Ultrasound of the Heart and Pericardium"  INDICATIONS:Abnormal vital signs, Chest pain, and Dyspnea Multiple views of the heart and pericardium were obtained in real-time with a multi-frequency probe.  PERFORMED WN:UUVOZD IMAGES ARCHIVED?: Yes LIMITATIONS:  Body habitus and Emergent procedure VIEWS USED: Parasternal long axis, Parasternal short axis, and Apical 4 chamber  INTERPRETATION: Cardiac activity present, Pericardial effusioin absent, Cardiac tamponade absent, Normal  contractility, and No D-sign or enlarged RV     Medications Ordered in ED Medications  ceFEPIme (MAXIPIME) 2 g in sodium chloride 0.9 % 100 mL IVPB (0 g Intravenous Stopped 04/16/2022 1538)  vancomycin variable dose per unstable renal function (pharmacist dosing) (has no administration in time range)  PHENYLephrine 80 mcg/ml in normal saline Adult IV Push Syringe (For Blood Pressure Support) (has no administration in time range)  norepinephrine (LEVOPHED) 4mg  in (0.016 mg/mL) premix infusion (20 mcg/min Intravenous Infusion Verify 04/16/2022 1751)  sodium bicarbonate 150 mEq in dextrose 5 % 1,150 mL infusion ( Intravenous New Bag/Given 04/16/2022 1727)  lactated ringers bolus 1,000 mL (0 mLs Intravenous Stopped 04-16-2022 1438)  lactated ringers bolus 1,000 mL (0 mLs Intravenous Stopped 2022/04/16 1438)  metroNIDAZOLE (FLAGYL) IVPB 500 mg (0 mg Intravenous Stopped 04/16/2022 1539)  vancomycin (VANCOREADY) IVPB 2000 mg/400 mL (0 mg Intravenous Stopped 04/16/22 1738)  lactated ringers bolus 1,000 mL (0 mLs Intravenous Stopped April 16, 2022 1539)  fentaNYL (SUBLIMAZE) injection 50 mcg (50 mcg Intravenous Given 2022/04/16 1611)  sodium bicarbonate injection 50 mEq (50 mEq Intravenous Given 04/16/2022 1621)  lactated ringers bolus 1,000 mL (1,000 mLs Intravenous New Bag/Given Apr 16, 2022 1745)    ED Course/ Medical Decision Making/ A&P                          Medical Decision Making Amount and/or Complexity of Data Reviewed Labs: ordered. Decision-making details documented in ED Course. Radiology: ordered. Decision-making details documented in ED Course.  Risk Prescription drug management.    This patient presents to the ED for concern of CP, SOB, lactate; this involves an extensive number of treatment options, and is a complaint that carries with it a high risk of complications and morbidity.  I considered the following differential and admission for this acute, potentially life threatening condition.   MDM:    Patient  tachycardic, hypotensive. He is currently afebrile but is tachypneic. Consider shock. In the context of nausea vomiting diarrhea consider hypovolemic shock and will resuscitate with fluids.  Also consider septic shock given white count found to be 26 and abdominal pain shortness of breath reported yesterday.  Unknown source at this time but will further investigate.  He is made a code sepsis given fluids and started on broad spectrum antibiotics.  Also consider obstructive shock such as cardiac tamponade or pulmonary embolism.  Bedside ultrasound does not demonstrate any right ventricular failure or pericardial effusion but will need to evaluate with CT.  No hives to raise concern for anaphylaxis and he is short of breath but no wheezing on exam.  Patient's contractility on the bedside echo was normal but he does have chest pain and shortness of breath, could consider cardiogenic shock as caused by STEMI/NSTEMI or acute heart failure, EKG was sinus tachycardia with no signs of ischemia, and will obtain biomarkers.   With back pain consider aortic pathology, cannot evaluate aorta on ultrasound due to body habitus but thoracic root appears wnl. Consider pyelonephritis or septic nephrolithiasis given back pain and will need to obtain CT urine. Cannot r/o PE though she doesn't have RHS at this time on echo. Lactate results 7.5 consider mesenteric ischemia though patient has no abdominal pain today, primarily back/chest pain and SOB as chief complaints. With tachypnea must consider respiratory source of infection such as PNA or ARDS though patient's lung sounds are clear and CXR with no significant disease noted. Will obtain viral panel as well.  With nausea vomiting, patient found to have no significant Electra abnormalities but does have severe AKI that could be prerenal or obstructive in context of back pain. Patient made code medical to get to CT scanner as soon as he is stable.   Clinical Course as of 04-26-22  1756  Fri 2022-04-26  1334 WBC(!): 26.8 [HN]  1334 DG Chest 2 View FINDINGS: Low lung volumes accentuate the pulmonary vasculature and cardiomediastinal silhouette. No focal airspace opacity. No pleural effusion or pneumothorax. Visualized bones and upper abdomen are unremarkable.  IMPRESSION: Low lung volumes without evidence of acute cardiopulmonary disease.   [HN]  1334 BP(!): 80/58 [HN]  1422 After 1L of IVF now on 2nd L, SBP >295 systolic, HR in 284X [HN]  1422 PIV placed by [place] [HN]  1422 BS echo doesn't show signs of RV failure that would suggest large PE. No pericardial effusion noted. Normal contractility.  [HN]  1423 Creatinine(!): 4.48 Large AKI [HN]  1423 Lactic Acid, Venous(!!): 7.5 Receiving 2nd L of IVF on pressure bag [HN]  1423 Resp panel by RT-PCR (RSV, Flu A&B, Covid) Anterior Nasal Swab neg [HN]  1423 Troponin I (High Sensitivity)(!): 85 [HN]  1423 AST(!): 91 [HN]  1423 ALT(!): 62 [HN]  1423 Total Bilirubin(!): 1.4 Mild transaminitis [HN]  1424 DG Chest 2 View FINDINGS: Low lung volumes accentuate the pulmonary vasculature and cardiomediastinal silhouette. No focal airspace opacity. No pleural effusion or pneumothorax. Visualized bones and upper abdomen are unremarkable.  IMPRESSION: Low lung volumes without evidence of acute cardiopulmonary disease.   [HN]  1457 BP in 90s, receiving 3rd L IVF [HN]  1510 Urinalysis, w/ Reflex to Culture (Infection Suspected) -Urine, Clean Catch(!) +UTI, consider possible urosepsis/septic stone [HN]  1511 Given acute renal failure, will perform CT chest and abdomen/pelvis without contrast initially [HN]  1515 Resp panel by RT-PCR (RSV, Flu A&B, Covid) Anterior Nasal Swab Neg [HN]  1517 Urine culture in process [HN]  1612 Went to evaluate patient he was significantly more short of breath and hypoxic to 80% with respirations in the 30s to 40s and heart rate increased from 120s to 140s-160s. Considered volume  overload/pulmonary edema from fluid bolus for hypotension though patient is 177 kg and received 3L IVF (17 cc/kg bolus) but reevaluated lungs with BS echo and there is no pulmonary edema, lung sounds clear.  Immediately placed patient on nonrebreather which increased oxygen to 98% and called respiratory therapy who placed patient on bipap for WOB.  Obtained an EKG which demonstrates sinus  tachycardia in the 160s.  Patient complaining of worsening back pain and will treat with fentanyl IV.  [HN]  1628 pH, Ven(!!): 7.070 Starting bicarb amp and gtt. Paged critical care. [HN]  1628 Patient is signed out to the oncoming ED physician Dr. Matilde Sprang who is made aware of his presentation, history, exam, workup, and plan. Plan is to start bicarb drip, obtain CT scans STAT, and admit to ICU.  [HN]    Clinical Course User Index [HN] Audley Hose, MD    Labs: I Ordered, and personally interpreted labs.  The pertinent results include:  those listed above  Imaging Studies ordered: I ordered imaging studies including CXR I independently visualized and interpreted imaging. I agree with the radiologist interpretation  Additional history obtained from chart review, wife at bedside.   Cardiac Monitoring: The patient was maintained on a cardiac monitor.  I personally viewed and interpreted the cardiac monitored which showed an underlying rhythm of: Sinus tachycardia  Reevaluation: After the interventions noted above, I reevaluated the patient and found that they have :worsened  Social Determinants of Health: Patient lives independently   Disposition:  Signed out to oncoming EDP, consulted to ICU, pending CT scan   Co morbidities that complicate the patient evaluation  Past Medical History:  Diagnosis Date   Hyperlipidemia    Hypertension      Medicines Meds ordered this encounter  Medications   lactated ringers bolus 1,000 mL   lactated ringers bolus 1,000 mL   DISCONTD: ceFEPIme (MAXIPIME)  2 g in sodium chloride 0.9 % 100 mL IVPB    Order Specific Question:   Antibiotic Indication:    Answer:   Other Indication (list below)    Order Specific Question:   Other Indication:    Answer:   Unknown source   metroNIDAZOLE (FLAGYL) IVPB 500 mg    Order Specific Question:   Antibiotic Indication:    Answer:   Other Indication (list below)    Order Specific Question:   Other Indication:    Answer:   Unknown source   DISCONTD: vancomycin (VANCOCIN) IVPB 1000 mg/200 mL premix    Order Specific Question:   Indication:    Answer:   Other Indication (list below)    Order Specific Question:   Other Indication:    Answer:   Unknown source   vancomycin (VANCOREADY) IVPB 2000 mg/400 mL    Order Specific Question:   Indication:    Answer:   Other Indication (list below)    Order Specific Question:   Other Indication:    Answer:   Unknown source   ceFEPIme (MAXIPIME) 2 g in sodium chloride 0.9 % 100 mL IVPB    Order Specific Question:   Antibiotic Indication:    Answer:   Sepsis   vancomycin variable dose per unstable renal function (pharmacist dosing)   lactated ringers bolus 1,000 mL   fentaNYL (SUBLIMAZE) injection 50 mcg   sodium bicarbonate injection 50 mEq   PHENYLephrine 80 mcg/ml in normal saline Adult IV Push Syringe (For Blood Pressure Support)   norepinephrine (LEVOPHED) 4mg  in 220mL (0.016 mg/mL) premix infusion   sodium bicarbonate 150 mEq in dextrose 5 % 1,150 mL infusion    Sodium Bicarbonate 150 mEq added to 1L D5W = 480 mOsm/L (effective osmolarity: 261 mOsm/L).   lactated ringers bolus 1,000 mL    I have reviewed the patients home medicines and have made adjustments as needed  Problem List / ED Course: Problem List Items Addressed  This Visit   None Visit Diagnoses     Septic shock (HCC)    -  Primary   Relevant Medications   metroNIDAZOLE (FLAGYL) IVPB 500 mg (Completed)   vancomycin (VANCOREADY) IVPB 2000 mg/400 mL (Completed)   ceFEPIme (MAXIPIME) 2 g in  sodium chloride 0.9 % 100 mL IVPB   vancomycin variable dose per unstable renal function (pharmacist dosing)   Acute unilateral obstructive uropathy       Nephrolithiasis       Relevant Medications   fentaNYL (SUBLIMAZE) injection 50 mcg (Completed)   AKI (acute kidney injury) (HCC)       Acute hypoxic respiratory failure (HCC)       Metabolic acidosis, increased anion gap (IAG)                       This note was created using dictation software, which may contain spelling or grammatical errors.    Loetta Rough, MD April 20, 2022 [phone]

## 2022-05-06 NOTE — ED Provider Triage Note (Signed)
Emergency Medicine Provider Triage Evaluation Note  Nathaniel Martin , a 52 y.o. male  was evaluated in triage.  Pt complains of n/v/d and dizziness since yesterday. He had abdominal pain yesterday, but not today. He admits to chills   Review of Systems  Positive: Dizziness,  blurry vision Negative: Chest pain, sob  Physical Exam  BP (!) 69/43 (BP Location: Left Arm)   Pulse (!) 113   Temp 97.8 F (36.6 C) (Oral)   Resp (!) 21   SpO2 95%  Gen:   Awake, obviously ill Resp:  Normal effort  MSK:   Moves extremities without difficulty  Other:    Medical Decision Making  Medically screening exam initiated at 12:19 PM.  Appropriate orders placed.  Nathaniel Martin was informed that the remainder of the evaluation will be completed by another provider, this initial triage assessment does not replace that evaluation, and the importance of remaining in the ED until their evaluation is complete.   Pt will be moved to a room immediately due to hypotension and ill appearance    Gwenevere Abbot, PA-C 04/20/22 1221

## 2022-05-06 NOTE — Telephone Encounter (Signed)
Patient is having episodes of dizziness, feeling a if he is going to pass out.  He has been having nausea and vomiting since yesterday and has had diarrhea this morning.  Patient's wife was advised to take him on to the ER for evaluation and she voices understanding.,

## 2022-05-06 NOTE — ED Notes (Signed)
Attempted to place foley with MD met resistance and MD stated that urology is on the way

## 2022-05-06 NOTE — Op Note (Signed)
PATIENT:  Nathaniel Martin  Preoperative diagnosis:  Right ureteral stone Urosepsis   Postoperative diagnosis:  same   Procedure:  Cystoscopy Right ureteral stent placement 6Fr 24cm JJ Right retrograde pyelography with interpretation   Surgeon: Harrell Gave, M.D. Assistant: Hinton Rao, MD  Anesthesia: General  Complications: None  EBL: Minimal  Specimens: None  Indication: [name] is a 52 y.o. male with right 27mm ureteral stone and urosepsis. After reviewing the management options for treatment, they have elected to proceed with the above surgical procedure(s). We have discussed the potential benefits and risks of the procedure, side effects of the proposed treatment, the likelihood of the patient achieving the goals of the procedure, and any potential problems that might occur during the procedure or recuperation. Informed consent has been obtained from wife  Findings:  Buried penis, normal urethra and bladder mucosa. Orthotopic Uos Low lying right kidney Mild right hydro No radioopaque stone identified Successful right stent placement Proximal urine culture obtained Remained intubated, to ICU following procedure  Description of procedure:   The patient was taken to the operating room and general anesthesia was induced.  The anesthesia team then proceeded to place an IJ central line. The patient was placed in the dorsal lithotomy position, prepped and draped in the usual sterile fashion A preoperative time-out was performed.   Cystourethroscopy was performed. The patient's urethra was examined and was normal. The bladder was then systematically examined in its entirety. There was no evidence for any bladder tumors, stones, or other mucosal pathology.    Attention then turned to the right ureteral orifice and a ureteral catheter was used to intubate the ureteral orifice.  Omnipaque contrast was injected through the ureteral catheter and a retrograde  pyelogram which revealed the above findings. A proximal urine culture was obtained  A Sensor guidewire was then advanced up the right ureter into the renal pelvis under fluoroscopic guidance.  The wire was then backloaded through the cystoscope and a ureteral stent was advance over the wire using Seldinger technique.  The stent was positioned appropriately under fluoroscopic and cystoscopic guidance.  The wire was then removed with an adequate stent curl noted in the renal pelvis as well as in the bladder.  The bladder was then emptied and the procedure ended. Due to respiratory status, patient to remain intubated to ICU.  Plan - Continue broad spectrum antibiotics until urine culture results - Continue foley catheter - Urology will continue to follow - Appreciate care from ICU team

## 2022-05-06 NOTE — Progress Notes (Signed)
Called stat to bedside d/t pt on NRB, dusky appearance, diaphoretic w/ tachypea RR 40-50 bpm, pt in resp distress.  MD at bedside, ordered bipap.  Pt placed on NIV/PS 5/5 100%, pt pulling large tidal volumes and large min volumes d/t increased volume and flow demands- MD aware.  After several minutes on bipap, pt continues to have large tidal volumes and tachypnea but pt states he feels bipap is helping, noted slight improvement in WOB on bipap.  MD x 2 at bedside to eval pt-aware.  Per pt, he denies nausea currently.  Explained to pt to remove bipap mask if he developes nausea and feels he could vomit.  Pt is aware and states he understands.

## 2022-05-06 NOTE — Anesthesia Procedure Notes (Signed)
Central Venous Catheter Insertion Performed by: Effie Berkshire, MD, anesthesiologist Start/End13-Feb-2024 7:00 PM, Apr 19, 2022 7:10 PM Patient location: Pre-op. Preanesthetic checklist: patient identified, IV checked, site marked, risks and benefits discussed, surgical consent, monitors and equipment checked, pre-op evaluation, timeout performed and anesthesia consent Position: Trendelenburg Lidocaine 1% used for infiltration and patient sedated Hand hygiene performed , maximum sterile barriers used  and Seldinger technique used Catheter size: 8.5 Fr Total catheter length 10. Central line was placed.Sheath introducer Swan type:thermodilution PA Cath depth:50 Procedure performed using ultrasound guided technique. Ultrasound Notes:anatomy identified, needle tip was noted to be adjacent to the nerve/plexus identified, no ultrasound evidence of intravascular and/or intraneural injection and image(s) printed for medical record Attempts: 1 Following insertion, line sutured, dressing applied and Biopatch. Post procedure assessment: blood return through all ports, free fluid flow and no air  Patient tolerated the procedure well with no immediate complications.

## 2022-05-06 NOTE — Progress Notes (Signed)
Pt transferred to/from CT scan via bipap w/ no apparent complications, however moving pt from bed to CT table and back did increase pt SOB and increased WOB noted w/ movement.  ED MD present and aware.  Sat stable t/o.

## 2022-05-06 NOTE — Progress Notes (Signed)
Pt transported from OR room 8 to 8P10 with complications. Pt intermittently coded on the way from OR.

## 2022-05-06 NOTE — ED Notes (Signed)
EDP verbal order to turn Levophed up to 57mcg/min.

## 2022-05-06 NOTE — ED Notes (Signed)
MD chase at bedside

## 2022-05-06 NOTE — Progress Notes (Signed)
   April 13, 2022 2212  Spiritual Encounters  Type of Visit Initial  Care provided to: Family  Referral source Code page  Reason for visit Code  OnCall Visit Yes  Spiritual Framework  Presenting Themes Other (comment) (grief and loss)  Family Stress Factors Loss  Interventions  Spiritual Care Interventions Made Compassionate presence;Reflective listening;Normalization of emotions;Prayer;Supported grief process   Chaplain responded to Code Blue. Chaplain met family immediately after they arrived on the unit. Chaplain provided emotional and spiritual support, therapeutic touch, prayer and support for grief and loss.   Note prepared by Abbott Pao, [place] Resident [phone]

## 2022-05-06 NOTE — Progress Notes (Signed)
Time of death called. See code sheet for full details.

## 2022-05-06 NOTE — ED Notes (Signed)
MD gave verbal for istat VBG

## 2022-05-06 NOTE — Death Summary Note (Signed)
DEATH SUMMARY   Patient Details  Name: Nathaniel Martin MRN: [id number] DOB: 11/04/1970  Admission/Discharge Information   Admit Date:  05/08/22  Date of Death:  2022/05/08  Time of Death:  May 08, 2022  Length of Stay: 0  Referring Physician: Dettinger, Fransisca Kaufmann, MD   Reason(s) for Hospitalization  Urinary tract infection, septic shock  Diagnoses  Preliminary cause of death:  Septic shock due to obstructive uropathy and urinary tract infection  Secondary Diagnoses (including complications and co-morbidities):  Principal Problem:   Septic shock ([place]) Active Problems:   Morbid obesity ([place])   Type 2 diabetes mellitus with other specified complication (HCC)   Metabolic acidosis, increased anion gap ([facility])   Nephrolithiasis   Acute unilateral obstructive uropathy   Acute hypoxic respiratory failure (HCC)   AKI (acute kidney injury) [facility])   Brief Hospital Course (including significant findings, care, treatment, and services provided and events leading to death)  Nathaniel Martin is a 52 y.o. year old male with history of hypertension, diabetes mellitus, obesity, hyperlipidemia.  He was admitted to [facility] on May 08, 2022 with 48 hours of nausea, vomiting, diarrhea and abdominal pain.  He subsequently developed dizziness and blurred vision over the last day.  On initial evaluation he was noted to have a profound leukocytosis, profound metabolic acidosis due to lactic acidosis and also acute renal failure.  He was started on BiPAP for his acidosis induced work of breathing as well as hypoxemia.  Broad-spectrum antibiotics were initiated.  A CT chest/abdomen/pelvis showed a mild right-sided obstructive uropathy with a 2 mm distal right ureteral calculus and perinephric fat stranding.  He was transiently on bicarbonate infusion.  He went to the operating room 2/2 for urgent cystoscopy, right ureteral stent placement and right retrograde pyelography.  He unfortunately remained unstable, Required  escalation of his norepinephrine to ultimately 50, vasopressin 0.03.  Intermittent pushes of epinephrine were required to get through the case.  He required short periods of chest compressions to maintain adequate perfusion and blood pressure.  At the end of the case he remained unstable.  He transitioned quickly back to the ICU and devolved, required immediate initiation of CPR.  Rhythm was PEA.  Despite all aggressive interventions perfusing rhythm was never able to be maintained.  CPR was approximately 30 minutes after arrival to the ICU.  Time of death was 07/07/2098.    Pertinent Labs and Studies  Significant Diagnostic Studies CT CHEST ABDOMEN PELVIS WO CONTRAST  Result Date: 2022/05/08 CLINICAL DATA:  Sepsis, abdominal pain, nausea/vomiting/diarrhea, dizziness EXAM: CT CHEST, ABDOMEN AND PELVIS WITHOUT CONTRAST TECHNIQUE: Multidetector CT imaging of the chest, abdomen and pelvis was performed following the standard protocol without IV contrast. RADIATION DOSE REDUCTION: This exam was performed according to the departmental dose-optimization program which includes automated exposure control, adjustment of the mA and/or kV according to patient size and/or use of iterative reconstruction technique. COMPARISON:  05/08/22, 05/17/2021 FINDINGS: CT CHEST FINDINGS Cardiovascular: Unenhanced imaging of the heart and great vessels unremarkable. No pericardial effusion. Normal caliber of the thoracic aorta. Atherosclerosis of the aortic arch and coronary vasculature. Mediastinum/Nodes: No enlarged mediastinal, hilar, or axillary lymph nodes. Thyroid gland, trachea, and esophagus demonstrate no significant findings. Lungs/Pleura: Evaluation of the lungs is limited by respiratory motion artifact. Scattered areas of atelectasis are seen within the dependent lungs. No acute airspace disease. There is a trace right pleural effusion. No pneumothorax. Central airways are patent. Musculoskeletal: No acute or destructive bony  lesions. Reconstructed images demonstrate no additional findings. CT  ABDOMEN PELVIS FINDINGS Hepatobiliary: Unremarkable unenhanced appearance of the liver and gallbladder. Pancreas: Unremarkable unenhanced appearance. Spleen: Unremarkable unenhanced appearance. Adrenals/Urinary Tract: There is an obstructing 2 mm distal right ureteral calculus reference image 118/3. There is mild right-sided obstructive uropathy, with right renal edema, right perinephric fat stranding, and mild right hydronephrosis and hydroureter. Punctate 2 mm nonobstructing right renal calculus is also noted. There is a punctate 3 mm nonobstructing calculus lower pole left kidney. Otherwise the left kidney is unremarkable. The adrenals and bladder are grossly normal. Stomach/Bowel: No bowel obstruction or ileus. Normal appendix right lower quadrant. Scattered diverticulosis of the distal colon without diverticulitis. Vascular/Lymphatic: No significant vascular findings on this unenhanced exam. No pathologic adenopathy. Reproductive: Prostate is unremarkable. Other: No free intraperitoneal fluid or free gas. No abdominal wall hernia. Musculoskeletal: No acute or destructive bony lesions. Reconstructed images demonstrate no additional findings. IMPRESSION: 1. Obstructing 2 mm distal right ureteral calculus, with mild right-sided obstructive uropathy and right perinephric fat stranding as above. 2. Other sub 3 mm nonobstructing bilateral renal calculi as above. 3. Scattered dependent atelectasis within the bilateral lungs. No acute airspace disease. 4. Trace right pleural effusion. 5. Sigmoid diverticulosis without diverticulitis. 6.  Aortic Atherosclerosis (ICD10-I70.0). Electronically Signed   By: Randa Ngo M.D.   On: 10-Apr-2022 17:02   DG Chest 2 View  Result Date: April 10, 2022 CLINICAL DATA:  Shortness of breath. EXAM: CHEST - 2 VIEW COMPARISON:  None Available. FINDINGS: Low lung volumes accentuate the pulmonary vasculature and  cardiomediastinal silhouette. No focal airspace opacity. No pleural effusion or pneumothorax. Visualized bones and upper abdomen are unremarkable. IMPRESSION: Low lung volumes without evidence of acute cardiopulmonary disease. Electronically Signed   By: Emmit Alexanders M.D.   On: 04-10-22 12:54    Microbiology Recent Results (from the past 240 hour(s))  Novel Coronavirus, NAA ([facility])     Status: None   Collection Time: 04/07/22 11:04 AM   Specimen: Nasopharyngeal(NP) swabs in vial transport medium  Result Value Ref Range Status   SARS-CoV-2, NAA Not Detected Not Detected Final    Comment: This nucleic acid amplification test was developed and its performance characteristics determined by [employer]. Nucleic acid amplification tests include RT-PCR and TMA. This test has not been FDA cleared or approved. This test has been authorized by FDA under an Emergency Use Authorization (EUA). This test is only authorized for the duration of time the declaration that circumstances exist justifying the authorization of the emergency use of in vitro diagnostic tests for detection of SARS-CoV-2 virus and/or diagnosis of COVID-19 infection under section 564(b)(1) of the Act, 21 U.S.C. 161WRU-0(A) (1), unless the authorization is terminated or revoked sooner. When diagnostic testing is negative, the possibility of a false negative result should be considered in the context of a patient's recent exposures and the presence of clinical signs and symptoms consistent with COVID-19. An individual without symptoms of COVID-19 and who is not shedding SARS-CoV-2 virus wo uld expect to have a negative (not detected) result in this assay.   Resp panel by RT-PCR (RSV, Flu A&B, Covid) Anterior Nasal Swab     Status: None   Collection Time: April 10, 2022 12:16 PM   Specimen: Anterior Nasal Swab  Result Value Ref Range Status   SARS Coronavirus 2 by RT PCR NEGATIVE NEGATIVE Final   Influenza A by PCR  NEGATIVE NEGATIVE Final   Influenza B by PCR NEGATIVE NEGATIVE Final    Comment: (NOTE) The Xpert Xpress SARS-CoV-2/FLU/RSV plus assay is intended as an  aid in the diagnosis of influenza from Nasopharyngeal swab specimens and should not be used as a sole basis for treatment. Nasal washings and aspirates are unacceptable for Xpert Xpress SARS-CoV-2/FLU/RSV testing.  Fact Sheet for Patients: EntrepreneurPulse.com.au  Fact Sheet for Healthcare Providers: IncredibleEmployment.be  This test is not yet approved or cleared by the [place] FDA and has been authorized for detection and/or diagnosis of SARS-CoV-2 by FDA under an Emergency Use Authorization (EUA). This EUA will remain in effect (meaning this test can be used) for the duration of the COVID-19 declaration under Section 564(b)(1) of the Act, 21 U.S.C. section 360bbb-3(b)(1), unless the authorization is terminated or revoked.     Resp Syncytial Virus by PCR NEGATIVE NEGATIVE Final    Comment: (NOTE) Fact Sheet for Patients: EntrepreneurPulse.com.au  Fact Sheet for Healthcare Providers: IncredibleEmployment.be  This test is not yet approved or cleared by the [place] FDA and has been authorized for detection and/or diagnosis of SARS-CoV-2 by FDA under an Emergency Use Authorization (EUA). This EUA will remain in effect (meaning this test can be used) for the duration of the COVID-19 declaration under Section 564(b)(1) of the Act, 21 U.S.C. section 360bbb-3(b)(1), unless the authorization is terminated or revoked.  Performed at [facility], [place] [address], [place], [place] [address]   Urine Culture     Status: None (Preliminary result)   Collection Time: 04-25-2022  2:30 PM   Specimen: Urine, Clean Catch  Result Value Ref Range Status   Specimen Description URINE, CLEAN CATCH  Final   Special Requests   Final    Reflexed from  [id number] Performed at [facility], [address], [place], [place] [address]    Culture PENDING  Incomplete   Report Status PENDING  Incomplete    Lab Basic Metabolic Panel: Recent Labs  Lab 04/07/22 1021 2022/04/25 1229 04/25/22 1615 04-25-22 1949 04/25/2022 2010 04/25/22 2044  NA 141 135 135 141 143 153*  K 3.9 4.6 4.4 4.1 3.8 4.7  CL 101 99  --   --   --   --   CO2 24 18*  --   --   --   --   GLUCOSE 110* 117*  --   --   --   --   BUN 16 29*  --   --   --   --   CREATININE 1.11 4.48*  --   --   --   --   CALCIUM 10.0 9.1  --   --   --   --    Liver Function Tests: Recent Labs  Lab 04/07/22 1021 25-Apr-2022 1229  AST 77* 91*  ALT 71* 62*  ALKPHOS 101 76  BILITOT 0.4 1.4*  PROT 7.6 7.5  ALBUMIN 4.3 3.2*   Recent Labs  Lab Apr 25, 2022 1229  LIPASE 25   No results for input(s): "AMMONIA" in the last 168 hours. CBC: Recent Labs  Lab 04/07/22 1021 25-Apr-2022 1229 25-Apr-2022 1615 Apr 25, 2022 1949 Apr 25, 2022 2010 April 25, 2022 2044  WBC 6.5 26.8*  --   --   --   --   NEUTROABS 5.8  --   --   --   --   --   HGB 14.4 14.0 12.9* 13.6 13.6 12.9*  HCT 42.8 42.8 38.0* 40.0 40.0 38.0*  MCV 92 96.0  --   --   --   --   PLT 201 104*  --   --   --   --  Cardiac Enzymes: No results for input(s): "CKTOTAL", "CKMB", "CKMBINDEX", "TROPONINI" in the last 168 hours. Sepsis Labs: Recent Labs  Lab 04/07/22 1021 2022/04/12 1228 April 12, 2022 1229 Apr 12, 2022 1541  WBC 6.5  --  26.8*  --   LATICACIDVEN  --  7.5*  --  8.0*    Procedures/Operations  Right ureteral stent placement 2/2 ACLS and CPR 2/2   Leslye Peer Apr 12, 2022, 9:18 PM

## 2022-05-06 NOTE — Anesthesia Postprocedure Evaluation (Addendum)
Anesthesia Post Note  Patient: Nathaniel Martin  Procedure(s) Performed: CYSTOSCOPY WITH STENT PLACEMENT (Right)     Patient location during evaluation: ICU Anesthesia Type: General Level of consciousness: sedated Vital Signs Assessment: vitals unstable Respiratory status: patient remains intubated per anesthesia plan Cardiovascular status: unstable Postop Assessment: no apparent nausea or vomiting Anesthetic complications: no Comments: See OR Note: PEA secondary to mixed respiratory and metabolic acidosis.   No notable events documented.  Last Vitals:  Vitals:   04-15-22 1802 2022/04/15 1815  BP: (!) 92/50   Pulse: (!) 132 (!) 136  Resp: (!) 30 (!) 32  Temp:    SpO2: 100% 96%    Last Pain:  Vitals:   04/15/2022 1457  TempSrc: Oral  PainSc:                  Effie Berkshire

## 2022-05-06 NOTE — Transfer of Care (Signed)
Immediate Anesthesia Transfer of Care Note  Patient: Nathaniel Martin  Procedure(s) Performed: CYSTOSCOPY WITH STENT PLACEMENT (Right)  Patient Location: ICU  Anesthesia Type: deceased  Level of Consciousness: deceased  Airway & Oxygen Therapy:  deceased  Post-op Assessment:  deceased  Post vital signs: deceased  Last Vitals:  Vitals Value Taken Time  BP 65/45 04/12/22 2041  Temp    Pulse 127 04-12-2022 2102  Resp    SpO2 81 % April 12, 2022 2102  Vitals shown include unvalidated device data.  Last Pain:  Vitals:   04/12/22 1457  TempSrc: Oral  PainSc:          Complications: No notable events documented.

## 2022-05-06 NOTE — Progress Notes (Addendum)
Arrived to 4M-05 from OR intubated. PEA arrest. Code blue on arrival and chest compressions started. ACLS protocol followed. See code sheet .

## 2022-05-06 NOTE — Progress Notes (Signed)
Pt transported to OR via bipap on 100% fio2 w/ no apparent respiratory complications.  RT report given to anesthesia and ICU RT.

## 2022-05-06 NOTE — Consult Note (Signed)
Urology Consult Note  Requesting Attending Physician:  Teressa Lower, MD Service Providing Consult: Urology  Consulting Attending: Dr. Lovena Neighbours   Reason for Consult:  ureteral stone, sepsis  HPI: Nathaniel Martin is seen in consultation for reasons noted above at the request of Kommor, Debe Coder, MD for evaluation of ureterl stone.  This is a 52 y.o. male with T2DM, HTN, HLD and 1 day history of generalized body aches. Presented to PCP yesterday with nausea and vomiting, felt to be viral given body aches. Returned to ED today with persistent vomiting, diarrhea, abdominal pain, and blurred vision. Afebrile but tachycardic to 140s, hypotensive requiring levo, significant increased work of breathing. WBC 27, UA with concern for infection, AKI 4.4 from baseline of ~1   Past Medical History: Past Medical History:  Diagnosis Date   Hyperlipidemia    Hypertension     Past Surgical History:  Past Surgical History:  Procedure Laterality Date   KIDNEY STONE SURGERY      Medication: Current Facility-Administered Medications  Medication Dose Route Frequency Provider Last Rate Last Admin   ceFEPIme (MAXIPIME) 2 g in sodium chloride 0.9 % 100 mL IVPB  2 g Intravenous Q24H Elsie Amis, RPH   Stopped at Apr 28, 2022 1538   norepinephrine (LEVOPHED) 4mg  in 261mL (0.016 mg/mL) premix infusion  0-40 mcg/min Intravenous Continuous Kommor, Madison, MD 37.5 mL/hr at 04-28-2022 1729 10 mcg/min at 2022/04/28 1729   ondansetron (ZOFRAN-ODT) disintegrating tablet 4 mg  4 mg Oral Once Dettinger, Fransisca Kaufmann, MD       PHENYLephrine 80 mcg/ml in normal saline Adult IV Push Syringe (For Blood Pressure Support)  80-200 mcg Intravenous Once PRN Kommor, Madison, MD       sodium bicarbonate 150 mEq in dextrose 5 % 1,150 mL infusion   Intravenous Continuous Kommor, Madison, MD 100 mL/hr at April 28, 2022 1727 New Bag at 04-28-2022 1727   vancomycin (VANCOREADY) IVPB 2000 mg/400 mL  2,000 mg Intravenous Once Elsie Amis, RPH 200 mL/hr at 04-28-22 1537 2,000 mg at 28-Apr-2022 1537   vancomycin variable dose per unstable renal function (pharmacist dosing)   Does not apply See admin instructions Elsie Amis, [facility]       Current Outpatient Medications  Medication Sig Dispense Refill   acetaminophen (TYLENOL) 500 MG tablet Take 1,000 mg by mouth every 6 (six) hours as needed.     amLODipine (NORVASC) 5 MG tablet TAKE 1 TABLET BY MOUTH EVERY DAY 90 tablet 0   hydrochlorothiazide (HYDRODIURIL) 25 MG tablet Take 1 tablet (25 mg total) by mouth daily. 90 tablet 3   losartan (COZAAR) 25 MG tablet Take 1 tablet (25 mg total) by mouth daily. 90 tablet 3   meloxicam (MOBIC) 15 MG tablet Take 1 tablet (15 mg total) by mouth daily. 90 tablet 3   metFORMIN (GLUCOPHAGE) 500 MG tablet Take 1 tablet (500 mg total) by mouth 2 (two) times daily with a meal. 180 tablet 3   ondansetron (ZOFRAN) 4 MG tablet Take 1 tablet (4 mg total) by mouth every 8 (eight) hours as needed for nausea or vomiting. 20 tablet 1   pravastatin (PRAVACHOL) 40 MG tablet Take 1 tablet (40 mg total) by mouth daily. 90 tablet 3   Semaglutide,0.25 or 0.5MG /DOS, 2 MG/3ML SOPN Inject 0.25 mg into the skin once a week. 3 mL 3    Allergies: No Known Allergies  Social History: Social History   Tobacco Use   Smoking status: Never   Smokeless tobacco: Never  Vaping Use   Vaping Use: Never used  Substance Use Topics   Alcohol use: No   Drug use: No    Family History Family History  Problem Relation Age of Onset   Diabetes Mother    Diabetes Father    Cancer Father    Diabetes Brother    Heart disease Paternal Grandmother    Heart attack Paternal Grandmother    Heart disease Paternal Grandfather    Heart attack Paternal Grandfather    Diabetes Brother     Review of Systems 10 systems were reviewed and are negative except as noted specifically in the HPI.  Objective   Vital signs in last 24 hours: BP (!) 83/38   Pulse (!) 145    Temp 98.3 F (36.8 C) (Oral)   Resp (!) 27   Ht 5\' 11"  (1.803 m)   Wt (!) 177.4 kg   SpO2 100%   BMI 54.53 kg/m   Physical Exam General: In bed, diaphoretic, toxic appearing HEENT: [place]/AT, EOMI, MMM Pulmonary: Increased WOB Cardiovascular: HDS, adequate peripheral perfusion Abdomen: Soft, NTTP, nondistended. GU: Voiding spontaneously Extremities: warm and well perfused Neuro: Appropriate, no focal neurological deficits  Most Recent Labs: Lab Results  Component Value Date   WBC 26.8 (H) May 08, 2022   HGB 12.9 (L) May 08, 2022   HCT 38.0 (L) May 08, 2022   PLT 104 (L) May 08, 2022    Lab Results  Component Value Date   NA 135 05-08-22   K 4.4 2022/05/08   CL 99 May 08, 2022   CO2 18 (L) 05-08-2022   BUN 29 (H) 05-08-2022   CREATININE 4.48 (H) 08-May-2022   CALCIUM 9.1 May 08, 2022    Lab Results  Component Value Date   INR 1.5 (H) 05-08-22   APTT 38 (H) 05/08/2022     Urine Culture: @[id number] (laburin,org,[id number])@   IMAGING: CT CHEST ABDOMEN PELVIS WO CONTRAST  Result Date: 05-08-2022 CLINICAL DATA:  Sepsis, abdominal pain, nausea/vomiting/diarrhea, dizziness EXAM: CT CHEST, ABDOMEN AND PELVIS WITHOUT CONTRAST TECHNIQUE: Multidetector CT imaging of the chest, abdomen and pelvis was performed following the standard protocol without IV contrast. RADIATION DOSE REDUCTION: This exam was performed according to the departmental dose-optimization program which includes automated exposure control, adjustment of the mA and/or kV according to patient size and/or use of iterative reconstruction technique. COMPARISON:  05-08-22, 05/17/2021 FINDINGS: CT CHEST FINDINGS Cardiovascular: Unenhanced imaging of the heart and great vessels unremarkable. No pericardial effusion. Normal caliber of the thoracic aorta. Atherosclerosis of the aortic arch and coronary vasculature. Mediastinum/Nodes: No enlarged mediastinal, hilar, or axillary lymph nodes. Thyroid gland, trachea, and  esophagus demonstrate no significant findings. Lungs/Pleura: Evaluation of the lungs is limited by respiratory motion artifact. Scattered areas of atelectasis are seen within the dependent lungs. No acute airspace disease. There is a trace right pleural effusion. No pneumothorax. Central airways are patent. Musculoskeletal: No acute or destructive bony lesions. Reconstructed images demonstrate no additional findings. CT ABDOMEN PELVIS FINDINGS Hepatobiliary: Unremarkable unenhanced appearance of the liver and gallbladder. Pancreas: Unremarkable unenhanced appearance. Spleen: Unremarkable unenhanced appearance. Adrenals/Urinary Tract: There is an obstructing 2 mm distal right ureteral calculus reference image 118/3. There is mild right-sided obstructive uropathy, with right renal edema, right perinephric fat stranding, and mild right hydronephrosis and hydroureter. Punctate 2 mm nonobstructing right renal calculus is also noted. There is a punctate 3 mm nonobstructing calculus lower pole left kidney. Otherwise the left kidney is unremarkable. The adrenals and bladder are grossly normal. Stomach/Bowel: No bowel obstruction or ileus. Normal appendix right lower quadrant.  Scattered diverticulosis of the distal colon without diverticulitis. Vascular/Lymphatic: No significant vascular findings on this unenhanced exam. No pathologic adenopathy. Reproductive: Prostate is unremarkable. Other: No free intraperitoneal fluid or free gas. No abdominal wall hernia. Musculoskeletal: No acute or destructive bony lesions. Reconstructed images demonstrate no additional findings. IMPRESSION: 1. Obstructing 2 mm distal right ureteral calculus, with mild right-sided obstructive uropathy and right perinephric fat stranding as above. 2. Other sub 3 mm nonobstructing bilateral renal calculi as above. 3. Scattered dependent atelectasis within the bilateral lungs. No acute airspace disease. 4. Trace right pleural effusion. 5. Sigmoid  diverticulosis without diverticulitis. 6.  Aortic Atherosclerosis (ICD10-I70.0). Electronically Signed   By: Randa Ngo M.D.   On: 2022-04-14 17:02   DG Chest 2 View  Result Date: 04-14-2022 CLINICAL DATA:  Shortness of breath. EXAM: CHEST - 2 VIEW COMPARISON:  None Available. FINDINGS: Low lung volumes accentuate the pulmonary vasculature and cardiomediastinal silhouette. No focal airspace opacity. No pleural effusion or pneumothorax. Visualized bones and upper abdomen are unremarkable. IMPRESSION: Low lung volumes without evidence of acute cardiopulmonary disease. Electronically Signed   By: Emmit Alexanders M.D.   On: 14-Apr-2022 12:54    ------  Assessment:  52 y.o. male with urosepsis 2/2 obstructing stone   Recommendations: - proceed to OR, case posted - Anticipate ICU following - continue broad spectrum abx until culture results and transitione to directed therapy   Thank you for this consult. Please contact the urology consult pager with any further questions/concerns.

## 2022-05-06 NOTE — ED Notes (Signed)
VBG results shown to Dr. Matilde Sprang.

## 2022-05-06 NOTE — ED Notes (Signed)
Critical Care MD, Dr Tamala Julian verbal order to run sodium bicarb with a pressure bag.

## 2022-05-06 NOTE — ED Notes (Signed)
Left IV infiltrated  MD going to place a central line  A line placed to right wrist

## 2022-05-06 NOTE — ED Notes (Signed)
Pt brought to OR with RT on bipap and report given at bedside

## 2022-05-06 NOTE — ED Notes (Signed)
Pt placed on 2L [place] for comfort.

## 2022-05-06 NOTE — Sepsis Progress Note (Signed)
Notified provider to chart in progress note if ideal body weight would be used to calculate fluid bolus rather that actual weight.

## 2022-05-06 NOTE — Code Documentation (Signed)
  Patient Name: Nathaniel Martin   MRN: [id number]   Date of Birth/ Sex: 1970/10/13 , male      Admission Date: 04-19-22  Attending Provider: Candee Furbish, MD  Primary Diagnosis: Metabolic acidosis, increased anion gap (IAG) [E87.29] Nephrolithiasis [N20.0] Acute unilateral obstructive uropathy [N13.9] AKI (acute kidney injury) ([place]) [N17.9] Septic shock ([place]) [A41.9, R65.21] Acute hypoxic respiratory failure ([place]) [J96.01]   Indication: Pt was in his usual state of health until this 836PM, when he was noted to be PEA arrest. Code blue was subsequently called. At the time of arrival on scene, ACLS protocol was underway.   Technical Description:  - CPR performance duration:  30 minutes  - Was defibrillation or cardioversion used? No   - Was external pacer placed? No  - Was patient intubated pre/post CPR? Yes   Medications Administered: Y = Yes; Blank = No Amiodarone    Atropine    Calcium    Epinephrine  8  Lidocaine    Magnesium    Norepinephrine    Phenylephrine    Sodium bicarbonate  6  Vasopressin    Other    Post CPR evaluation:  - Final Status - Was patient successfully resuscitated ? No   Miscellaneous Information:  - Time of death:  900pm  - Primary team notified?  Yes  - Family Notified? Yes     Leigh Aurora, DO   2022/04/19, 9:05 PM

## 2022-05-06 NOTE — ED Triage Notes (Signed)
Patient here for evaluation of nausea, vomiting, diarrhea, abdominal pain, dizziness, and blurred vision that started yesterday. Patient is alert, oriented, and ill-appearing at this time.

## 2022-05-06 DEATH — deceased

## 2022-07-08 ENCOUNTER — Ambulatory Visit: Payer: BC Managed Care – PPO | Admitting: Family Medicine

## 2022-11-16 IMAGING — CT CT RENAL STONE PROTOCOL
2 of 4 series · 17 of 46 positions shown, 19 images · non-contrast
Comparison: None.

CLINICAL DATA: Nephrolithiasis



[Series 2: stone full · axial · 1.27mm/px · z∈[+848,+1308]mm · 14 of 102 slices shown, 16 images]
[im 5/102  soft-tissue]
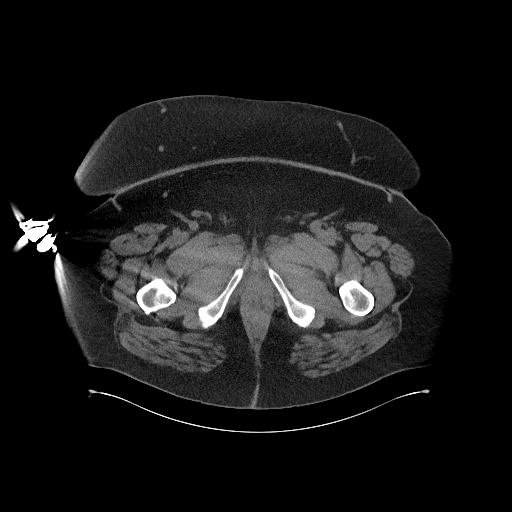
[im 5/102  bone]
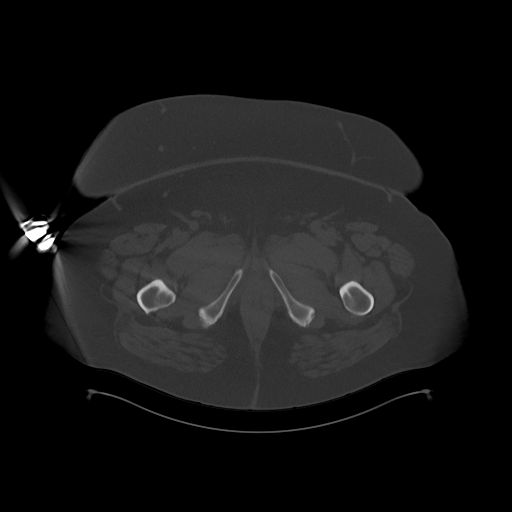
[im 14/102  soft-tissue]
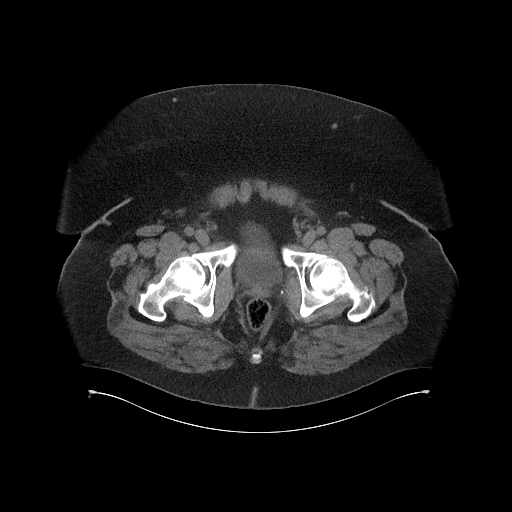
[im 18/102  soft-tissue]
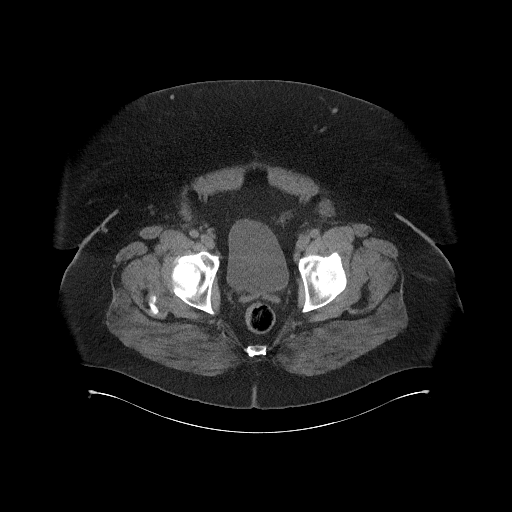
[im 27/102  soft-tissue]
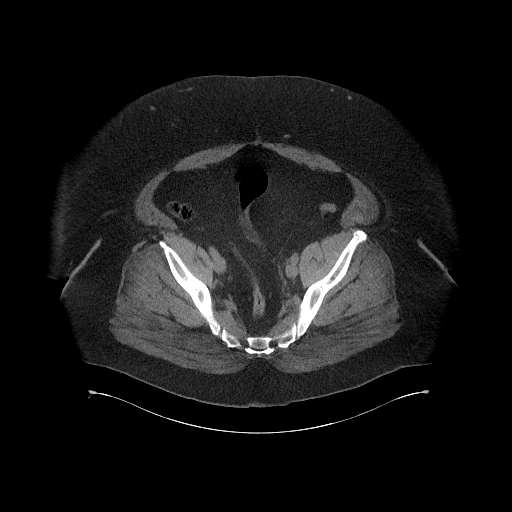
[im 36/102  soft-tissue]
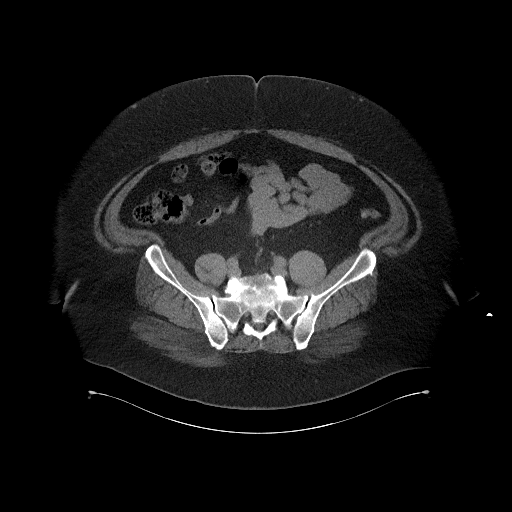
[im 40/102  soft-tissue]
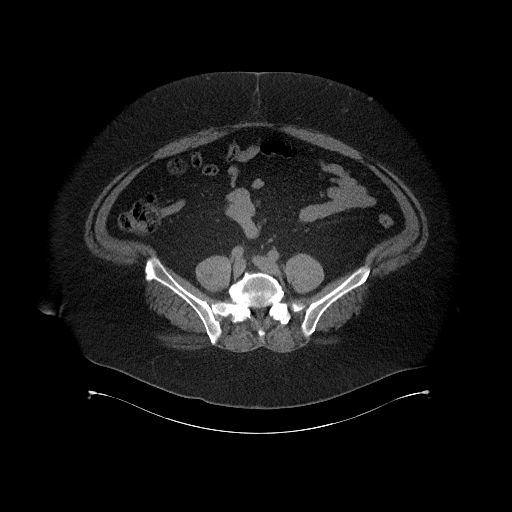
[im 49/102  soft-tissue]
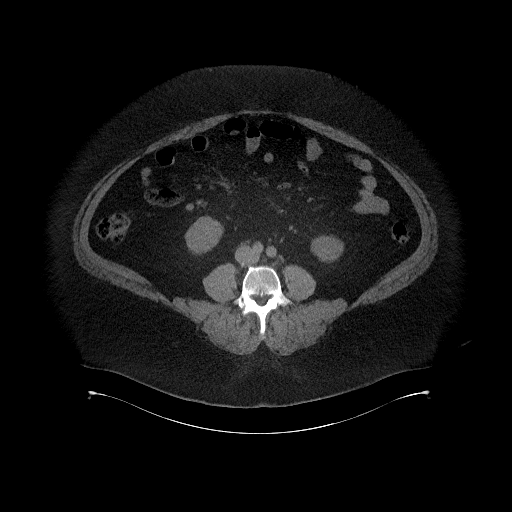
[im 53/102  soft-tissue]
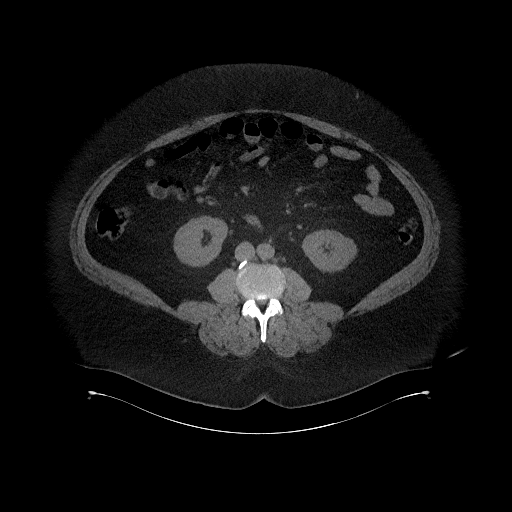
[im 62/102  soft-tissue]
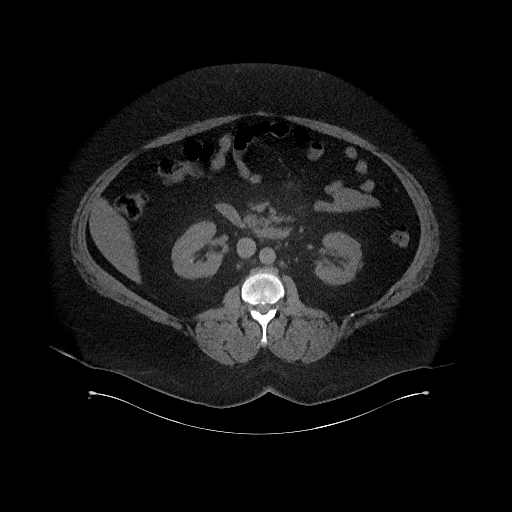
[im 62/102  bone]
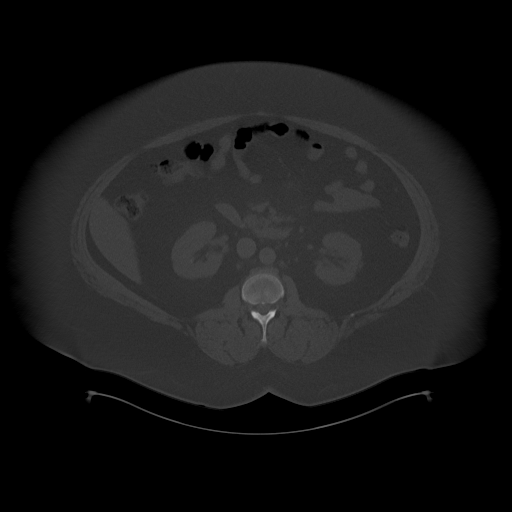
[im 66/102  soft-tissue]
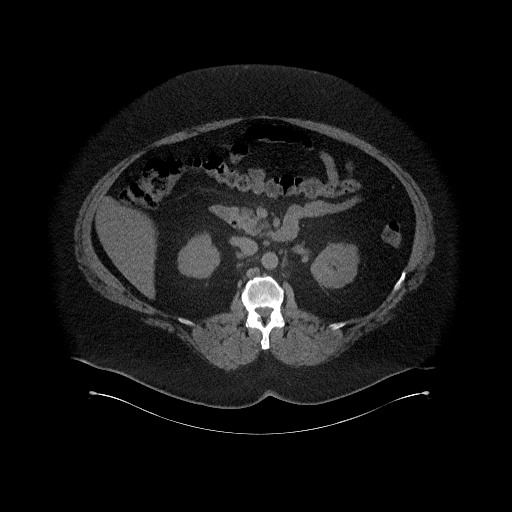
[im 75/102  soft-tissue]
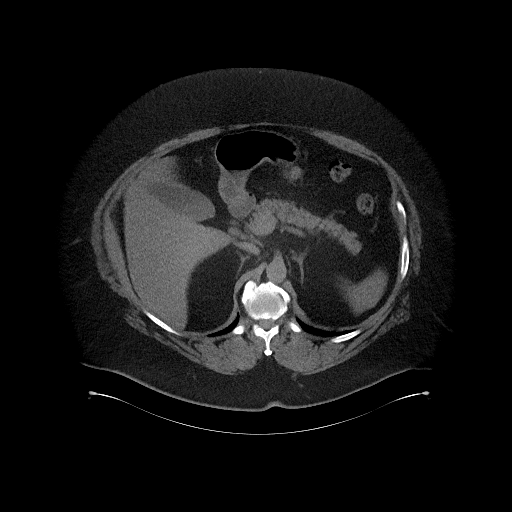
[im 84/102  soft-tissue]
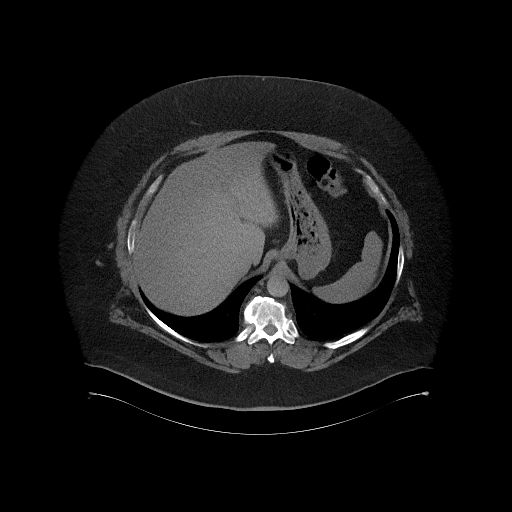
[im 88/102  soft-tissue]
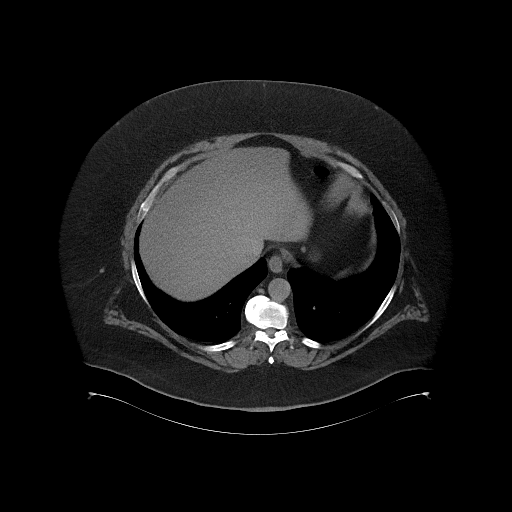
[im 97/102  soft-tissue]
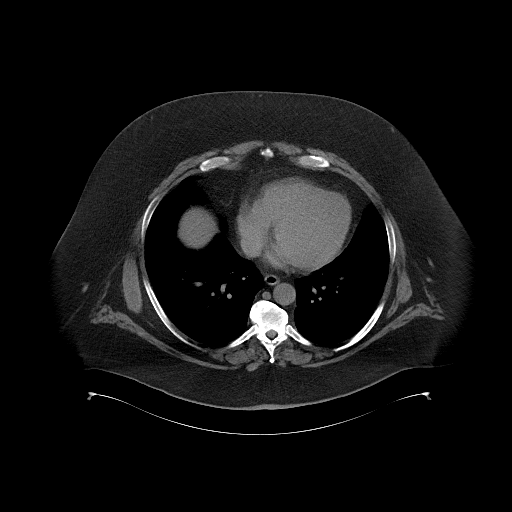

[Series 5: coronal · coronal · 1.02mm/px · 3 of 153 slices shown]
[im 51/153  soft-tissue]
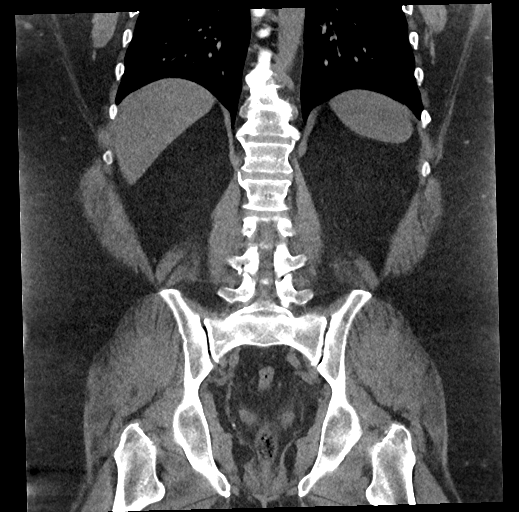
[im 68/153  soft-tissue]
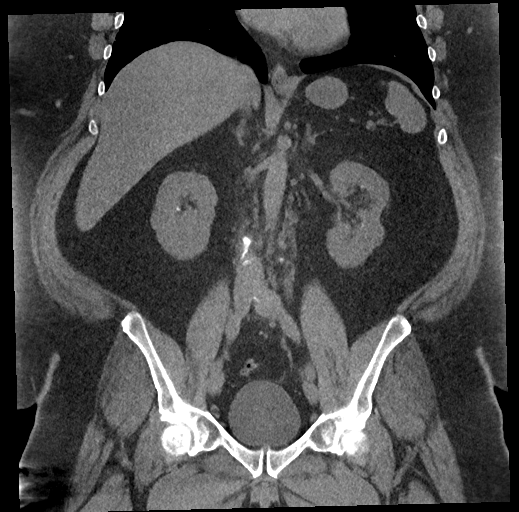
[im 85/153  soft-tissue]
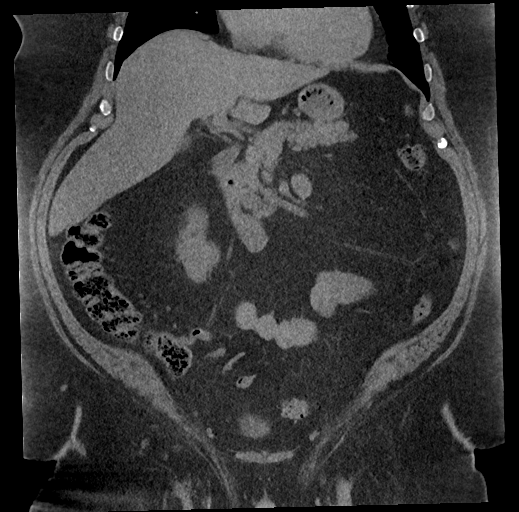

[17 of 46 positions shown; findings below may reference images not displayed]

FINDINGS: Lower chest: No acute abnormality.

Hepatobiliary: No focal liver abnormality is seen. The gallbladder
is unremarkable.

Pancreas: Unremarkable. No pancreatic ductal dilatation or
surrounding inflammatory changes.

Spleen: Normal in size without focal abnormality.

Adrenals/Urinary Tract: Adrenal glands are unremarkable. No
hydronephrosis. Nonobstructive 1-3 mm renal stones bilaterally. No
ureterolithiasis. The bladder is unremarkable.

Stomach/Bowel: The stomach is within normal limits. There is no
evidence of bowel obstruction.The appendix is normal. Sigmoid
diverticulosis.

Vascular/Lymphatic: No significant vascular findings are present. No
enlarged abdominal or pelvic lymph nodes.

Reproductive: Unremarkable.

Other: Tiny fat containing umbilical hernia. No bowel containing
hernia. No ascites. No free air.

Musculoskeletal: No acute osseous abnormality. No suspicious lytic
or blastic lesions. Multilevel degenerative changes of the spine.
Mild hip and SI joint arthritis.
IMPRESSION: Nonobstructive nephrolithiasis bilaterally with stones measuring 1-3
mm. No hydronephrosis or ureterolithiasis.

No other acute abnormality in the abdomen or pelvis.
